# Patient Record
Sex: Male | Born: 1935 | Race: White | Hispanic: No | Smoking: Former smoker
Health system: Southern US, Community
[De-identification: ages and names within clinical notes are randomized; demographics above are authoritative.]

## PROBLEM LIST (undated history)

## (undated) DIAGNOSIS — Z8501 Personal history of malignant neoplasm of esophagus: Secondary | ICD-10-CM

## (undated) DIAGNOSIS — C801 Malignant (primary) neoplasm, unspecified: Secondary | ICD-10-CM

## (undated) DIAGNOSIS — K219 Gastro-esophageal reflux disease without esophagitis: Secondary | ICD-10-CM

## (undated) DIAGNOSIS — R7303 Prediabetes: Secondary | ICD-10-CM

## (undated) DIAGNOSIS — I4510 Unspecified right bundle-branch block: Secondary | ICD-10-CM

## (undated) DIAGNOSIS — N200 Calculus of kidney: Secondary | ICD-10-CM

## (undated) DIAGNOSIS — L237 Allergic contact dermatitis due to plants, except food: Secondary | ICD-10-CM

## (undated) DIAGNOSIS — Z87442 Personal history of urinary calculi: Secondary | ICD-10-CM

## (undated) HISTORY — DX: Unspecified right bundle-branch block: I45.10

## (undated) HISTORY — DX: Malignant (primary) neoplasm, unspecified: C80.1

## (undated) HISTORY — PX: OTHER SURGICAL HISTORY: SHX169

---
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
## 2001-01-13 ENCOUNTER — Ambulatory Visit (HOSPITAL_COMMUNITY): Admission: RE | Admit: 2001-01-13 | Discharge: 2001-01-13 | Payer: Self-pay | Admitting: *Deleted

## 2001-01-13 ENCOUNTER — Encounter (INDEPENDENT_AMBULATORY_CARE_PROVIDER_SITE_OTHER): Payer: Self-pay | Admitting: Specialist

## 2001-10-26 ENCOUNTER — Encounter (INDEPENDENT_AMBULATORY_CARE_PROVIDER_SITE_OTHER): Payer: Self-pay | Admitting: Specialist

## 2001-10-26 ENCOUNTER — Ambulatory Visit (HOSPITAL_COMMUNITY): Admission: RE | Admit: 2001-10-26 | Discharge: 2001-10-26 | Payer: Self-pay | Admitting: *Deleted

## 2002-07-07 ENCOUNTER — Ambulatory Visit (HOSPITAL_COMMUNITY): Admission: RE | Admit: 2002-07-07 | Discharge: 2002-07-07 | Payer: Self-pay | Admitting: *Deleted

## 2002-07-07 ENCOUNTER — Encounter (INDEPENDENT_AMBULATORY_CARE_PROVIDER_SITE_OTHER): Payer: Self-pay | Admitting: *Deleted

## 2003-11-08 ENCOUNTER — Encounter (INDEPENDENT_AMBULATORY_CARE_PROVIDER_SITE_OTHER): Payer: Self-pay | Admitting: Specialist

## 2003-11-08 ENCOUNTER — Ambulatory Visit (HOSPITAL_COMMUNITY): Admission: RE | Admit: 2003-11-08 | Discharge: 2003-11-08 | Payer: Self-pay | Admitting: *Deleted

## 2004-06-03 ENCOUNTER — Encounter (INDEPENDENT_AMBULATORY_CARE_PROVIDER_SITE_OTHER): Payer: Self-pay | Admitting: Specialist

## 2004-06-03 ENCOUNTER — Ambulatory Visit (HOSPITAL_COMMUNITY): Admission: RE | Admit: 2004-06-03 | Discharge: 2004-06-03 | Payer: Self-pay | Admitting: *Deleted

## 2005-02-17 ENCOUNTER — Ambulatory Visit (HOSPITAL_COMMUNITY): Admission: RE | Admit: 2005-02-17 | Discharge: 2005-02-17 | Payer: Self-pay | Admitting: *Deleted

## 2005-02-17 ENCOUNTER — Encounter (INDEPENDENT_AMBULATORY_CARE_PROVIDER_SITE_OTHER): Payer: Self-pay | Admitting: *Deleted

## 2005-02-25 ENCOUNTER — Encounter: Admission: RE | Admit: 2005-02-25 | Discharge: 2005-02-25 | Payer: Self-pay | Admitting: *Deleted

## 2005-02-26 ENCOUNTER — Ambulatory Visit: Payer: Self-pay | Admitting: Gastroenterology

## 2005-02-26 ENCOUNTER — Ambulatory Visit (HOSPITAL_COMMUNITY): Admission: RE | Admit: 2005-02-26 | Discharge: 2005-02-26 | Payer: Self-pay | Admitting: Gastroenterology

## 2005-02-26 ENCOUNTER — Encounter (INDEPENDENT_AMBULATORY_CARE_PROVIDER_SITE_OTHER): Payer: Self-pay | Admitting: Specialist

## 2005-03-17 ENCOUNTER — Ambulatory Visit (HOSPITAL_COMMUNITY): Admission: RE | Admit: 2005-03-17 | Discharge: 2005-03-17 | Payer: Self-pay | Admitting: Thoracic Surgery

## 2005-03-30 ENCOUNTER — Inpatient Hospital Stay (HOSPITAL_COMMUNITY): Admission: EM | Admit: 2005-03-30 | Discharge: 2005-04-10 | Payer: Self-pay | Admitting: Thoracic Surgery

## 2005-03-31 ENCOUNTER — Encounter (INDEPENDENT_AMBULATORY_CARE_PROVIDER_SITE_OTHER): Payer: Self-pay | Admitting: *Deleted

## 2005-04-15 ENCOUNTER — Encounter: Admission: RE | Admit: 2005-04-15 | Discharge: 2005-04-15 | Payer: Self-pay | Admitting: Thoracic Surgery

## 2005-05-07 ENCOUNTER — Encounter: Admission: RE | Admit: 2005-05-07 | Discharge: 2005-05-07 | Payer: Self-pay | Admitting: Thoracic Surgery

## 2005-05-23 ENCOUNTER — Encounter: Admission: RE | Admit: 2005-05-23 | Discharge: 2005-05-23 | Payer: Self-pay | Admitting: *Deleted

## 2005-06-18 ENCOUNTER — Encounter: Admission: RE | Admit: 2005-06-18 | Discharge: 2005-06-18 | Payer: Self-pay | Admitting: Thoracic Surgery

## 2005-06-24 ENCOUNTER — Observation Stay (HOSPITAL_COMMUNITY): Admission: EM | Admit: 2005-06-24 | Discharge: 2005-06-25 | Payer: Self-pay | Admitting: Emergency Medicine

## 2005-06-24 ENCOUNTER — Encounter (INDEPENDENT_AMBULATORY_CARE_PROVIDER_SITE_OTHER): Payer: Self-pay | Admitting: Specialist

## 2005-06-24 HISTORY — PX: LAPAROSCOPIC CHOLECYSTECTOMY: SUR755

## 2005-07-23 ENCOUNTER — Ambulatory Visit (HOSPITAL_COMMUNITY): Admission: RE | Admit: 2005-07-23 | Discharge: 2005-07-23 | Payer: Self-pay | Admitting: *Deleted

## 2005-07-30 ENCOUNTER — Encounter: Admission: RE | Admit: 2005-07-30 | Discharge: 2005-07-30 | Payer: Self-pay | Admitting: Thoracic Surgery

## 2005-10-28 ENCOUNTER — Encounter: Admission: RE | Admit: 2005-10-28 | Discharge: 2005-10-28 | Payer: Self-pay | Admitting: Thoracic Surgery

## 2006-02-16 ENCOUNTER — Encounter: Admission: RE | Admit: 2006-02-16 | Discharge: 2006-02-16 | Payer: Self-pay | Admitting: Otolaryngology

## 2006-02-25 ENCOUNTER — Encounter: Admission: RE | Admit: 2006-02-25 | Discharge: 2006-02-25 | Payer: Self-pay | Admitting: Thoracic Surgery

## 2006-05-04 ENCOUNTER — Encounter (INDEPENDENT_AMBULATORY_CARE_PROVIDER_SITE_OTHER): Payer: Self-pay | Admitting: *Deleted

## 2006-05-04 ENCOUNTER — Ambulatory Visit (HOSPITAL_COMMUNITY): Admission: RE | Admit: 2006-05-04 | Discharge: 2006-05-04 | Payer: Self-pay | Admitting: *Deleted

## 2006-08-20 ENCOUNTER — Ambulatory Visit (HOSPITAL_COMMUNITY): Admission: RE | Admit: 2006-08-20 | Discharge: 2006-08-20 | Payer: Self-pay | Admitting: *Deleted

## 2006-08-20 ENCOUNTER — Encounter (INDEPENDENT_AMBULATORY_CARE_PROVIDER_SITE_OTHER): Payer: Self-pay | Admitting: Specialist

## 2006-09-15 ENCOUNTER — Encounter: Admission: RE | Admit: 2006-09-15 | Discharge: 2006-09-15 | Payer: Self-pay | Admitting: Thoracic Surgery

## 2006-09-15 ENCOUNTER — Ambulatory Visit: Payer: Self-pay | Admitting: Thoracic Surgery

## 2006-12-16 ENCOUNTER — Ambulatory Visit (HOSPITAL_COMMUNITY): Admission: RE | Admit: 2006-12-16 | Discharge: 2006-12-16 | Payer: Self-pay | Admitting: Internal Medicine

## 2007-09-26 IMAGING — RF DG ESOPHAGUS
10 of 13 series · 17 of 24 positions shown · non-contrast
Comparison: none

CLINICAL DATA: Dysphagia.  Prior esophagogastrectomy and now difficulty swallowing.
 BARIUM SWALLOW:

[Series 1: run · 2 of 9 slices shown (1 of 10)]
[im 1/9]
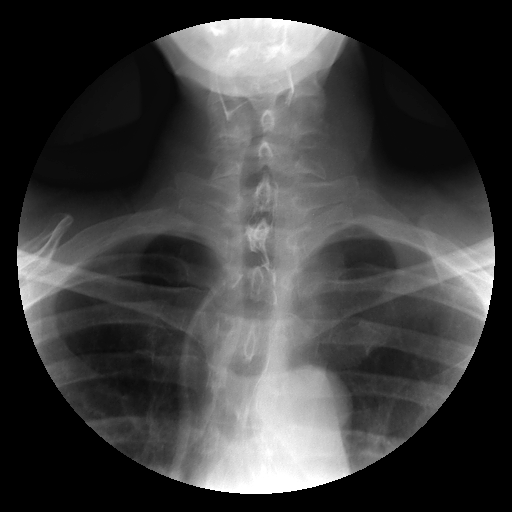
[im 9/9]
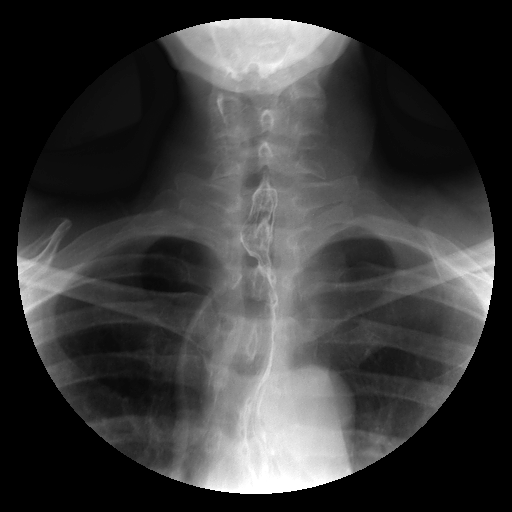

[Series 2: run · 2 of 6 slices shown (2 of 10)]
[im 1/6]
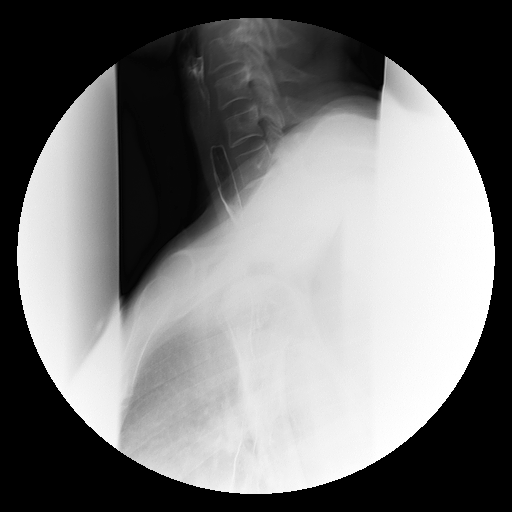
[im 6/6]
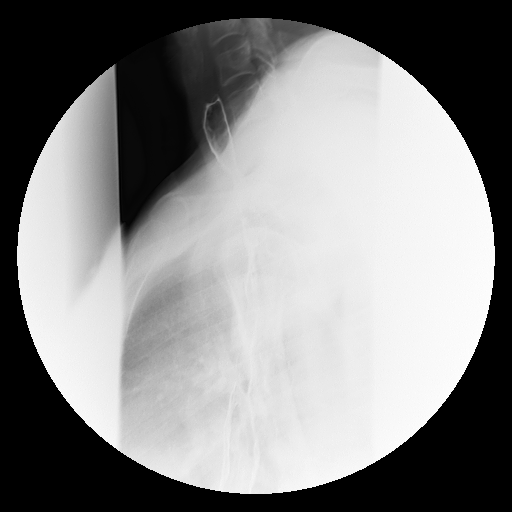

[Series 4: run · 1 of 1 slices shown (3 of 10)]
[im 1/1]
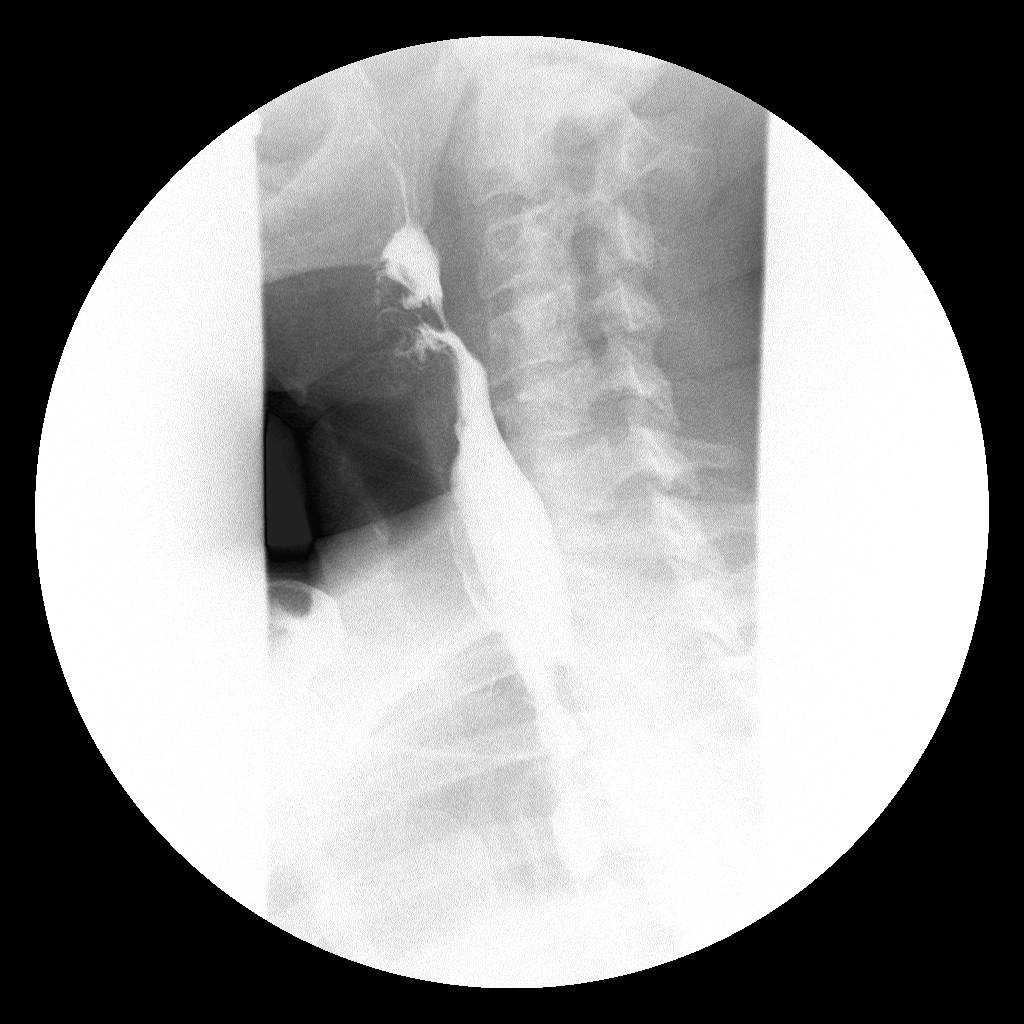

[Series 5: run · 1 of 1 slices shown (4 of 10)]
[im 1/1]
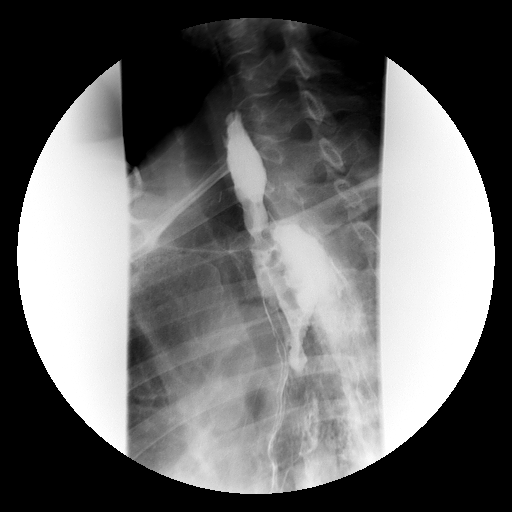

[Series 6: run · 3 of 12 slices shown (5 of 10)]
[im 3/12]
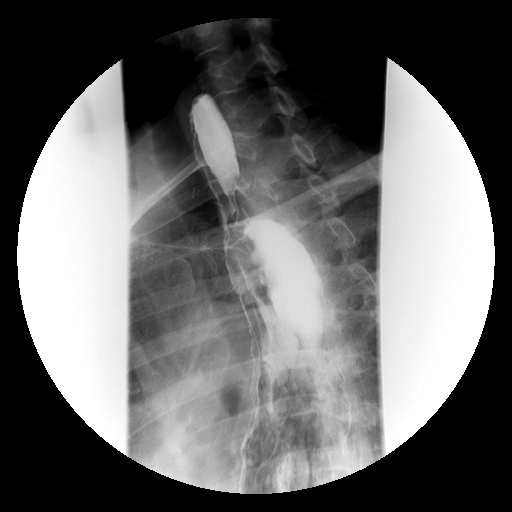
[im 6/12]
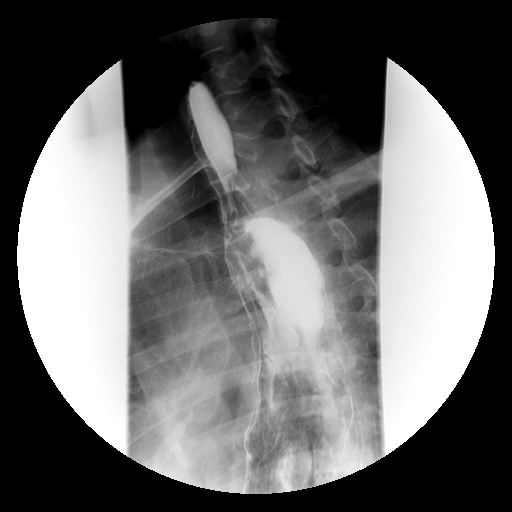
[im 12/12]
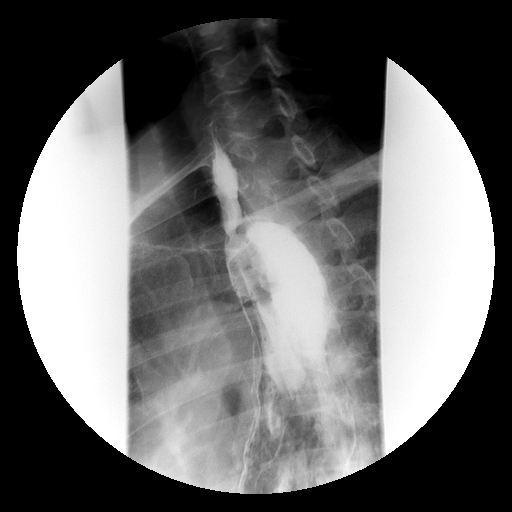

[Series 7: run · 1 of 1 slices shown (6 of 10)]
[im 1/1]
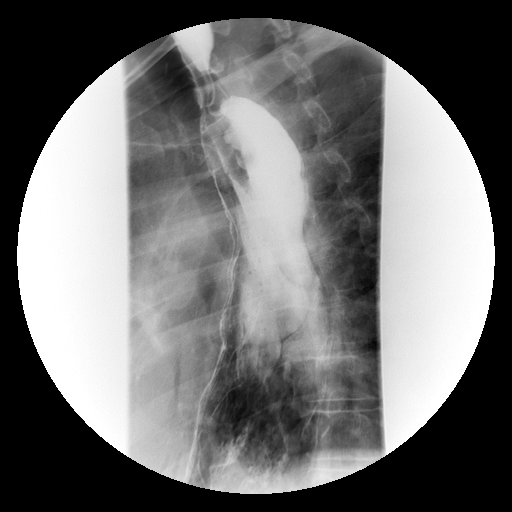

[Series 8: run · 1 of 1 slices shown (7 of 10)]
[im 1/1]
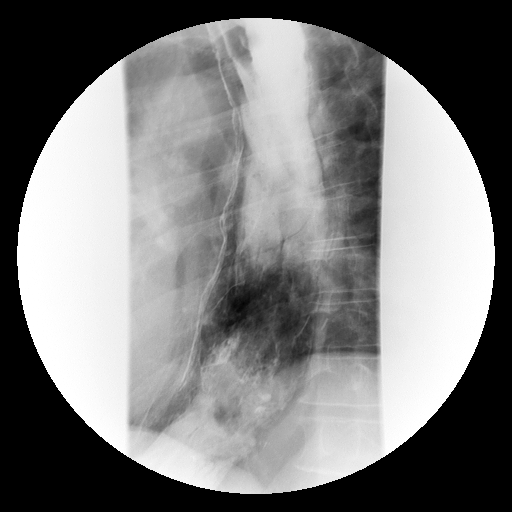

[Series 10: run · 1 of 1 slices shown (8 of 10)]
[im 1/1]
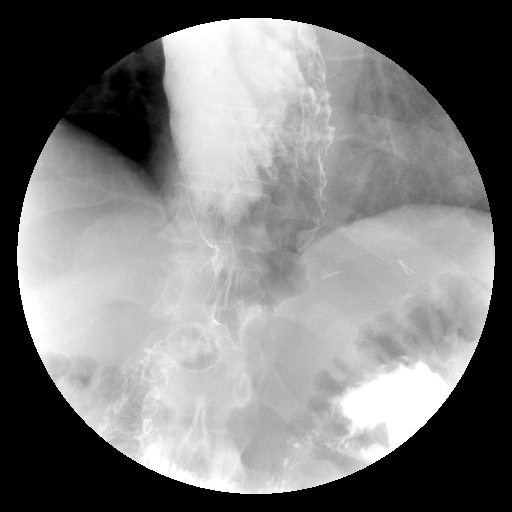

[Series 11: run · 1 of 1 slices shown (9 of 10)]
[im 1/1]
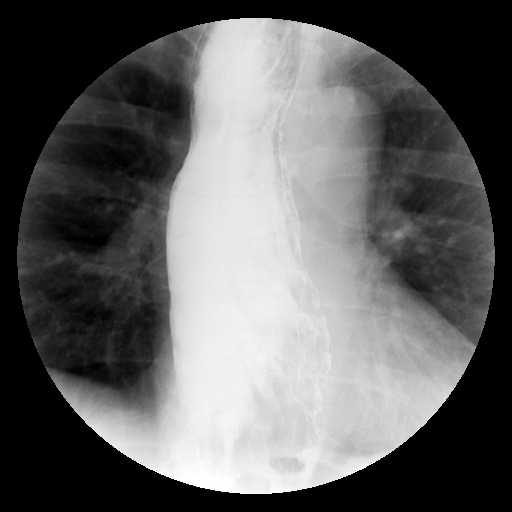

[Series 13: run · 4 of 11 slices shown (10 of 10)]
[im 1/11]
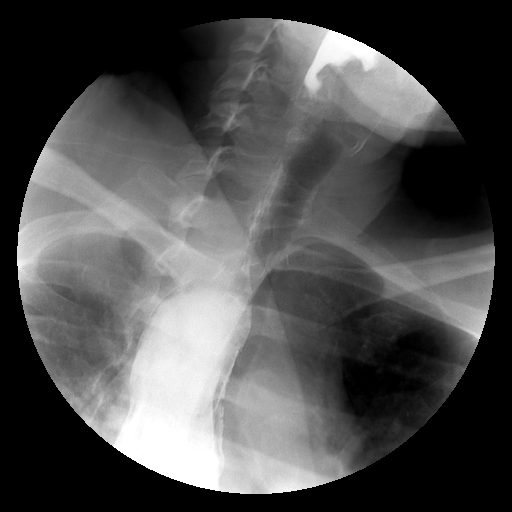
[im 3/11]
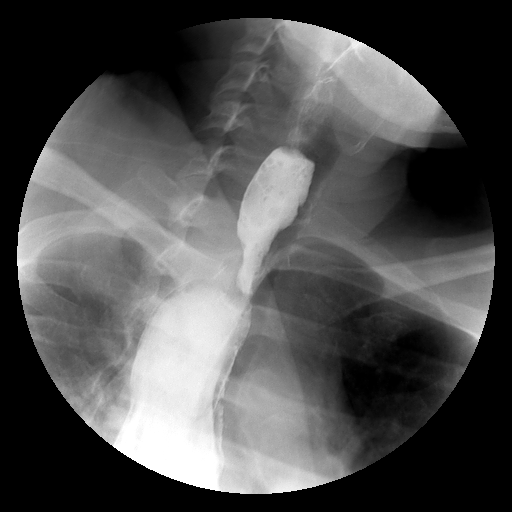
[im 6/11]
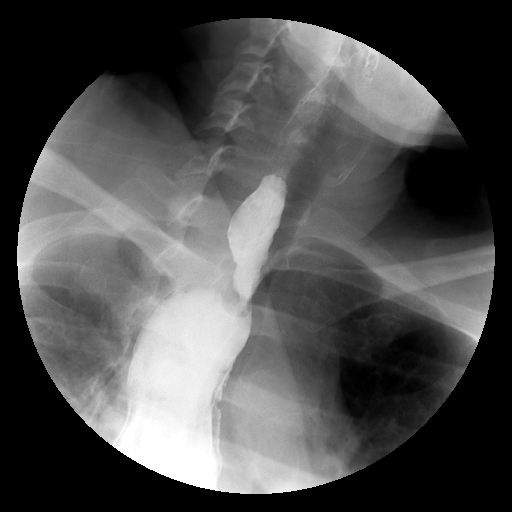
[im 11/11]
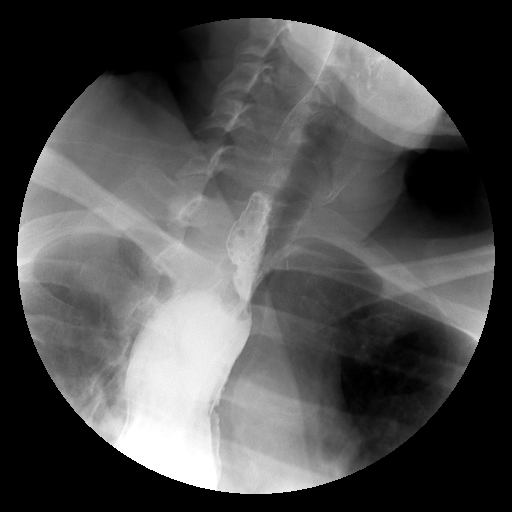

[17 of 24 positions shown; findings below may reference images not displayed]

FINDINGS: A single barium swallow was performed.  There is persistent narrowing at the level of anastomosis within the upper thoracic esophagus in this patient who has undergone gastric pull through.   The swallowing mechanism appears grossly normal.  The gastric pull through appears normal.
IMPRESSION: Persistent narrowing at the level of anastomosis of the upper thoracic esophagus with a gastric pull through.  No other abnormality.

## 2007-11-17 ENCOUNTER — Ambulatory Visit (HOSPITAL_COMMUNITY): Admission: RE | Admit: 2007-11-17 | Discharge: 2007-11-17 | Payer: Self-pay | Admitting: *Deleted

## 2007-11-17 ENCOUNTER — Encounter (INDEPENDENT_AMBULATORY_CARE_PROVIDER_SITE_OTHER): Payer: Self-pay | Admitting: *Deleted

## 2008-01-13 ENCOUNTER — Encounter: Admission: RE | Admit: 2008-01-13 | Discharge: 2008-01-13 | Payer: Self-pay | Admitting: Cardiovascular Disease

## 2008-03-31 ENCOUNTER — Encounter: Admission: RE | Admit: 2008-03-31 | Discharge: 2008-04-26 | Payer: Self-pay | Admitting: Internal Medicine

## 2008-04-26 ENCOUNTER — Encounter: Admission: RE | Admit: 2008-04-26 | Discharge: 2008-04-26 | Payer: Self-pay | Admitting: *Deleted

## 2008-05-23 ENCOUNTER — Ambulatory Visit (HOSPITAL_COMMUNITY): Admission: RE | Admit: 2008-05-23 | Discharge: 2008-05-23 | Payer: Self-pay | Admitting: *Deleted

## 2008-05-23 ENCOUNTER — Encounter (INDEPENDENT_AMBULATORY_CARE_PROVIDER_SITE_OTHER): Payer: Self-pay | Admitting: *Deleted

## 2010-09-10 NOTE — Op Note (Signed)
Peter Richmond, Peter Richmond               ACCOUNT NO.:  [id number]   MEDICAL RECORD NO.:  [id number]          PATIENT TYPE:  AMB   LOCATION:  ENDO                         FACILITY:  [facility]   PHYSICIAN:  Georgiana Spinner, M.D.    DATE OF BIRTH:  Sep 09, 1931   DATE OF PROCEDURE:  DATE OF DISCHARGE:                               OPERATIVE REPORT   PROCEDURE:  Upper endoscopy with dilation and biopsy.   INDICATIONS:  Dysphagia.   ANESTHESIA:  Fentanyl 75 mcg, Versed 7 mg.   PROCEDURE:  With the patient mildly sedated in the left lateral  decubitus position, the Pentax videoscopic endoscope was inserted in the  mouth, passed under direct vision through the esophagus, into the  stomach and intestine.  Photographs were taken.  From this point, the  endoscope was pulled back into the stomach and a guidewire was passed.  The endoscope was pulled back to the gastroesophageal junction and this  was located fluoroscopically.  The endoscope was then withdrawn.  Subsequently, a 15 Savary dilator was passed rather easily over the  guidewire and then the guidewire was removed with the dilator.  The  endoscope was reinserted to this level and a biopsy was taken of what  appears to be Barrett's esophagus recurrence.  The endoscope was  withdrawn.  The patient's vital signs and pulse oximeter remained  stable.  The patient tolerated the procedure well without apparent  complications.   FINDINGS:  Narrowing of anastomosis of the stomach and esophagus with  biopsies taken after dilation.   PLAN:  Await biopsy report to aim clinical response.  The patient will  call me for results and follow up with me as an outpatient.           ______________________________  Georgiana Spinner, M.D.     GMO/MEDQ  D:  05/23/2008  T:  05/23/2008  Job:  [id number]

## 2010-09-10 NOTE — Op Note (Signed)
NAMESHANDY, VI               ACCOUNT NO.:  [id number]   MEDICAL RECORD NO.:  [id number]          PATIENT TYPE:  AMB   LOCATION:  ENDO                         FACILITY:  [facility]   PHYSICIAN:  Georgiana Spinner, M.D.    DATE OF BIRTH:  1932/02/06   DATE OF PROCEDURE:  11/17/2007  DATE OF DISCHARGE:                               OPERATIVE REPORT   PROCEDURE:  Upper endoscopy.   INDICATIONS:  GERD with Barrett's esophagus and previous resection for  adenocarcinoma.   ANESTHESIA:  Fentanyl 75 mcg, Versed 5 mg.   PROCEDURE:  With the patient mildly sedated in the left lateral  decubitus position, the Pentax videoscopic endoscope was inserted in the  mouth, passed under direct vision through the esophagus which appeared  normal until we reached the distal esophagus which was short and there  appeared to be an area of Barrett's photographed and biopsied.  We  entered into the stomach.  Fundus, body was all that was remaining.  We  advanced to small bowel which appeared normal and was photographed.  The  endoscope was then pulled back into the stomach placed in retroflexion  to view the stomach from below.  The endoscope was straightened and  withdrawn taking circumferential views of the remaining gastric and  esophageal mucosa stopping in the proximal esophagus where there was a  whitish nodule that was photographed and biopsied.  The patient's vital  signs, pulse oximeter remained stable.  The patient tolerated procedure  well without apparent complication.   FINDINGS:  Proximal esophageal nodule, distal esophageal Barrett's.  Await biopsy reports.  The patient will call me for results and follow-  up with me as needed as an outpatient.           ______________________________  Georgiana Spinner, M.D.     GMO/MEDQ  D:  11/17/2007  T:  11/17/2007  Job:  [id number]

## 2010-09-13 NOTE — Procedures (Signed)
[facility]  Patient:    Peter Richmond, Peter Richmond Visit Number: [id number] MRN: [id number]          Service Type: END Location: ENDO Attending Physician:  Sabino Gasser Dictated by:   Sabino Gasser, M.D. Proc. Date: 01/13/01 Admit Date:  01/13/2001                             Procedure Report  PROCEDURE PERFORMED:  Colonoscopy.  ENDOSCOPIST:  Sabino Gasser, M.D.  INDICATIONS FOR PROCEDURE:  Colon polyps.  ANESTHESIA:  None further given.  See endoscopy note.  DESCRIPTION OF PROCEDURE:  With the patient mildly sedated in the left lateral decubitus position, a rectal exam was performed which was unremarkable. Subsequently, the Olympus videoscopic colonoscope was inserted in the rectum and passed under direct vision into the cecum.  The cecum was identified by the ileocecal valve and appendiceal orifice, both of which were photographed. From this point, the colonoscope was slowly withdrawn, taking circumferential views of the entire colonic mucosa stopping in the rectum only which appeared normal on direct and showed internal hemorrhoids on retroflex view.  The endoscope was straightened and withdrawn.  Patients vital signs and pulse oximeter remained stable.  The patient tolerated the procedure well and without apparent complications.  FINDINGS:  Significant diverticular disease of the sigmoid colon.  Internal hemorrhoids.  Otherwise unremarkable colonoscopic examination.  PLAN:  See endoscopy note for further evaluation. Dictated by:   Sabino Gasser, M.D. Attending Physician:  Sabino Gasser DD:  01/13/01 TD:  01/13/01 Job: [id number]

## 2010-09-13 NOTE — Op Note (Signed)
NAMEJYLAN, LOEZA               ACCOUNT NO.:  [id number]   MEDICAL RECORD NO.:  [id number]          PATIENT TYPE:  INP   LOCATION:  2310                         FACILITY:  [facility]   PHYSICIAN:  Ines Bloomer, M.D. 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   POSTOPERATIVE DIAGNOSIS:  Barrett's esophagus with stage I adenocarcinoma of  the esophagus, high grade dysplasia.   OPERATION PERFORMED:  Transhiatal esophagectomy, jejunostomy, pyloroplasty.   SURGEON:  Ines Bloomer, M.D.   FIRST ASSISTANT:  Rowe Clack, P.A.-C.   ANESTHESIA:  General.   After the percutaneous insertion of all monitoring lines, the patient  underwent general anesthesia and was placed in the supine position with the  neck extended after elevating it on a roll and turning the neck to the right  side.  A xiphoid to midline incision was made stopping just about 3-4 cm  above the umbilicus.  The midline was entered and exploration was carried  out.  A Kocher maneuver was performed dissecting out the duodenum freeing it  up in the avascular plane posteriorly and dissecting it medially to the  pancreas across the inferior vena cava.  After the Kocher maneuver was done,  the ligament to the left lateral segment of the liver was taken down from  the diaphragm to pull the liver segment over to expose the hiatus.  After  this had been done, a Balfour retractor was placed and then the upper hand  retractor was placed to elevate the costal margin as well as to hold the  liver up.  Dissection was started in the greater curvature, got into the  lesser sac, and the omentum was divided between the stomach and the  transverse colon preserving the right gastroepiploic arcade.  This was  divided between clamps and  tied with a 2-0 silk or divided with  electrocautery.  It was divided from the gastroepiploic artery and then  superiorly going along the greater curvature.  Then, we used the Harmonic  scalpel to divide the short gastrics and dissect the greater curvature of  the cardia off the spleen.  Then, the lesser omentum was divided, again,  with electrocautery and the Harmonic scalpel and we were able to dissect the  stomach up, identifying the coronary vein and right gastric artery which was  clamped with a vascular clamp.  Then, the esophagus was dissected free from  around the stomach.  There was no gross mass felt because the patient had  had Barrett's esophagus, high grade dysplasia, and had stage I carcinoma by  previous biopsy by Dr. Virginia Rochester and Dr. Christella Hartigan.  Then, using the Harmonic  scalpel, the distal esophagus was dissected free from the surrounding  tissues and then we had to use blunt dissection to dissect the esophagus up  tracheal bifurcation.  Attention was turned to the jejunum and 18-20 cm from  the ligament of  Treitz, a horizontal mattress suture was placed in the  antimesenteric border and the jejunum was opened and a 16 red rubber  Roxan Hockey was inserted and tied in place with 0 silk.  Approximately 10-12 cm  from the insertion, we noticed a small bowel diverticulum, it did not appear  to be inflamed.  We asked Dr. Johna Sheriff, the general surgeon to give [place] an  opinion, and he said that we should just leave this alone and we did.  We  did run the jejunostomy tube 8-10 cm distal to this.  Then, a Dalbert Batman  jejunostomy was performed imbricating the jejunostomy tube with 3-0 silk on  the antimesenteric border bringing the jejunostomy tube out through a  separate stab wound and tied in place with 0 silk and suturing the jejunum  to the abdominal wall with 3-0 silk.  Attention was turned to the pylorus  and the pylorus was opened longitudinally with electrocautery and the  Harmonic scalpel  and then closed in a Heineke-Mikulicz fashion, opening it  longitudinally and closing it transversely.  We used a Gambee stitch to  close it transversely and then interrupted #1 through and through silks  medially and laterally to the Gambee which was the center silk suture.  After this had been done, several more reinforced Lembert sutures were  placed and then the omentum was tied as a patch to the suture line to  reinforce the suture line.  After the Heineke-Mikulicz jejunostomy had been  done, the left neck was opened with an incision along the left  sternocleidomastoid, dissection was carried down reflecting the internal  jugular vein and carotid artery and vagus nerve out laterally and  identifying the esophagus which was looped with a Penrose drain and then  proximal dissection was done very gently to try to preserve the left  recurrent nerve which was identified.  In this way, we were able to dissect  the entire esophagus up, the proximal esophagus was dilated with an EZ-45  stapler and a suture placed on the esophagus and tied to an umbilical tape  which was pulled through the posterior mediastinum.  Then, we used the endo-  GIA stapler to divide across the proximal margin of the proximal stomach  going from the cardia to the lesser curvature with two applications of the  stapler and the specimen was sent for frozen section and the proximal and  distal margins were negative for cancer.  Then, the staple line was  reinforced with running 4-0 Prolene.  The stomach was then placed in a  camera bag and a 30 mL balloon was pulled through the posterior mediastinum  and placed in the camera bag and the stomach was sutured to the camera bag  and we pulled the camera bag and the Foley catheter pulling the stomach up  through the posterior mediastinum up to the neck.  The camera bag and the Foley catheter were removed, a transverse opening was made in the stomach  and this was dilated, and  then the staple line from the esophagus was  removed.  The NG tube had been pulled back.  An HGB-35 stapler was then  fired with one limb in the esophagus and one limb in the stomach to create  the first part of the anastomosis.  Then, the anterior part of the esophagus  was then sutured to the stomach with interrupted 2-0 silk sutures and then  the anterior portion was closed in a triangular method using three 3-0  silk  sutures to bring the stomach to the staple line and then firing a TX stapler  across it medially and then laterally to complete the anastomosis.  Several  3-0 silk Lembert sutures were placed to reinforce the anastomosis.  The NG  tube was then placed down to the pylorus by palpation, a stab wound was made  laterally and a Jackson-Pratt  drain was brought out laterally.  Tisseel was  applied to the staple line.  The neck was then closed with interrupted 2-0  Vicryl in the muscle layer and Ethicon skin clips.  The stomach was sutured  to prevent herniation right at the hiatus.  Bleeding was electrocoagulated  and the area was copiously irrigated.  The abdomen was closed with running  #1 PDS and interrupted #1 Vicryl and Ethicon skin clips.  The patient was  then returned to the intensive care unit in serious condition.           ______________________________  Ines Bloomer, M.D.     DPB/MEDQ  D:  03/31/2005  T:  03/31/2005  Job:  [id number]   cc:   Georgiana Spinner, M.D.  Fax: [phone]

## 2010-09-13 NOTE — H&P (Signed)
NAMERODRIQUEZ, THORNER               ACCOUNT NO.:  [id number]   MEDICAL RECORD NO.:  [id number]          PATIENT TYPE:  INP   LOCATION:  5739                         FACILITY:  [facility]   PHYSICIAN:  Ines Bloomer, M.D. DATE OF BIRTH:  1932/04/02   DATE OF ADMISSION:  03/30/2005  DATE OF DISCHARGE:                                HISTORY & PHYSICAL   PRIMARY CARE PHYSICIAN:  Dr. Lendell Caprice.   GASTROENTEROLOGIST PHYSICIAN:  Dr. Virginia Rochester.   CHIEF COMPLAINT:  Esophageal cancer.   HISTORY OF PRESENT ILLNESS:  This is a 77 year old Caucasian male who has  been followed by Dr. Lendell Caprice for gastroesophageal reflux disease for many  years. Approximately six year ago, he was referred to Dr. Virginia Rochester for an upper  endoscopy as a follow-up regarding his reflux disease. He was found to have  Barrett's esophagus at that time and has been followed with routine  endoscopy initially one time per year and then two times per year for the  last three years of follow-up. In February of 2005, he was found to have a  low grade dysplasia and in October of 2006 an esophageal cancer was found.  The patient has been referred by Dr. Virginia Rochester to Dr. Edwyna Shell for surgical therapy.  The patient does occasionally complain of a cough with sputum production  that is clear. He denies hemoptysis, shortness of breath, dyspnea on  exertion, orthopnea, fever, chills, weight changes, history of PE/DVT,  peripheral edema, angina, palpitations, and TIA/CVA symptoms. The patient  does have reflux symptoms.   The patient is being admitted today for preparation for surgery in the a.m.  of March 31, 2005.   PAST MEDICAL HISTORY:  1.  Barrett's esophagus.  2.  Gastroesophageal reflux disease.  3.  Asthma.  4.  Esophageal cancer.   PAST SURGICAL HISTORY:  Tonsillectomy.   ALLERGIES:  PREVACID which causes a rash.   MEDICATIONS:  1.  Risperdal 350 milligrams p.r.n. back pain.  2.  Allegra 180 milligrams p.o. daily.  3.  Nexium p.o.  b.i.d.  4.  Advair 100 per 50 inhaled p.r.n.  5.  Flonase 50 mcg 1-2 sprays each nostril p.r.n.  6.  Mobic 7.5 milligrams p.o. p.r.n. back pain.  7.  Aspirin 325 milligrams p.o. daily.   REVIEW OF SYSTEMS:  Please see HPI for significant positives and negatives.  Otherwise negative for diabetes mellitus, kidney disease and cardiac  disease.   SOCIAL HISTORY:  This is a married male with two children, lives with  family. He never drinks alcohol and only smoked an occasional cigar 15 or 20  years ago. He is retired and does continue to drive. He will have assistance  available after discharge.   FAMILY HISTORY:  Mother with myocardial infarction. Father with myocardial  infarction and question of pancreatic disease. Siblings with myocardial  infarction and diabetes mellitus.   PHYSICAL EXAMINATION:  VITAL SIGNS:  Blood pressure 127/70, heart rate 74,  respirations 20, temperature 97.5, weight 200 pounds.  GENERAL:  This is a 77 year old Caucasian male in no acute distress. He is  alert and oriented.  HEENT:  Normocephalic and atraumatic. Pupils are equal, round, and reactive  to light and accommodation. Extraocular movements intact. Oral mucosa is  pink and moist.  NECK:  Supple with no JVD and no bruits. The carotids are palpable. There is  palpable anterior chain lymphadenopathy bilaterally.  LUNGS:  Respirations are symmetric, unlabored and clear.  CARDIOVASCULAR:  Regular rate and rhythm. No murmurs, gallops, or rubs.  ABDOMEN:  Soft, nontender, and nondistended with normal active bowel sounds.  GENITOURINARY:  Deferred.  RECTAL:  Deferred.  EXTREMITIES:  There is no evidence of edema, varicosities, venous stasis  changes or skin breakdown. All extremities are warm to the touch.  PULSES:  Radial, femoral, popliteal and pedal pulses are 2+ bilaterally.  NEUROLOGICAL:  Nonfocal. The patient is alert and oriented x3. Gait is  steady. Muscle strength is 5/5 throughout all  extremities and symmetric.  Deep tendon reflexes are 2+.   ASSESSMENT:  Esophageal cancer in the setting of Barrett's esophagus.   PLAN:  1.  Transhiatal esophagectomy March 31, 2005 by Dr. Edwyna Shell.  2.  Admission on March 30, 2005 for bowel prep as well as labs and EKG      prior to surgery.      Pecola Leisure, PA    ______________________________  Ines Bloomer, M.D.    AY/MEDQ  D:  03/30/2005  T:  03/31/2005  Job:  [id number]   cc:   Patient's chart

## 2010-09-13 NOTE — Op Note (Signed)
NAMERICKY, GALLERY               ACCOUNT NO.:  [id number]   MEDICAL RECORD NO.:  [id number]          PATIENT TYPE:  AMB   LOCATION:  ENDO                         FACILITY:  [facility]   PHYSICIAN:  Georgiana Spinner, M.D.    DATE OF BIRTH:  [birth year]   DATE OF PROCEDURE:  DATE OF DISCHARGE:                                 OPERATIVE REPORT   PROCEDURE:  Upper endoscopy.   INDICATIONS:  Gastroesophageal reflux disease with Barrett's esophagus and  borderline low-grade dysplasia.   ANESTHESIA:  Demerol 50 mg, Versed 5 mg.   DESCRIPTION OF PROCEDURE:  With the patient mildly sedated in the left  lateral decubitus position, the Olympus videoscopic endoscope was inserted  in the mouth and passed under direct vision through the esophagus, which  appeared normal until we reached the distal esophagus and there was rather  extensive Barrett's seen, photographed and multiple biopsies taken around  the circumference of the Barrett's esophagus that was seen.  We then  subsequently entered into the stomach.  The  fundus, body, antrum, duodenal  bulb, and second portion of the duodenum were visualized.  From this point,  the endoscope was slowly withdrawn taking circumferential views of the  duodenal mucosa until the endoscope had been pulled back into the stomach,  placed in retroflexion and viewed the stomach from below.  The endoscope was  then straightened, withdrawn, taking circumferential views of the remaining  gastric and esophageal mucosa, stopping only in the body of the stomach  where a polyp was seen and biopsied.  The endoscope was then withdrawn  taking circumferential views of the remaining gastric and esophageal mucosa,  stopping to biopsy further the changes of Barrett's.  The patient's vital  signs and pulse oximetry remained stable.  The patient tolerated the  procedure well without apparent complication.   FINDINGS:  1.  Barrett's esophagus, biopsied.  2.  Small polyp with body  of stomach biopsied.   PLAN:  Await biopsy report.  The patient will call me for results and follow  up with me as an outpatient.      GMO/MEDQ  D:  06/03/2004  T:  06/03/2004  Job:  [id number]

## 2010-09-13 NOTE — Op Note (Signed)
Peter Richmond, Peter Richmond               ACCOUNT NO.:  [id number]   MEDICAL RECORD NO.:  [id number]          PATIENT TYPE:  AMB   LOCATION:  ENDO                         FACILITY:  [facility]   PHYSICIAN:  Georgiana Spinner, M.D.    DATE OF BIRTH:  [birth year]   DATE OF PROCEDURE:  05/04/2006  DATE OF DISCHARGE:                               OPERATIVE REPORT   SURGEON:  Georgiana Spinner, M.D.   PROCEDURE:  Colonoscopy.   ANESTHESIA:  Fentanyl 80 mcg, Versed 6 mg.   DESCRIPTION OF PROCEDURE:  With the patient mildly sedated in the left  lateral decubitus position, a rectal examination was performed, which  was unremarkable.  Subsequently, the Pentax videoscopic pediatric  colonoscope was inserted into the rectum and passed under direct vision  to the cecum, identified by the ileocecal valve and base of the cecum,  both of which were photographed.  The prep was somewhat suboptimal,  surprisingly, in that there were areas where there was brown stool with  solid material that had to be suctioned and somewhat clogged the scope.  But from this point, the colonoscope was slowly withdrawn, taking  circumferential views of colonic mucosa, stopping to take random  biopsies from what appeared to be normal-appearing mucosa, until we  reached the rectum, which appeared normal.  But the rectum showed  hemorrhoids on retroflex view.  The endoscope was straightened and  withdrawn.  The patient's vital signs and pulse oximetry remained  stable.  The patient tolerated the procedure well and without apparent  complications.   FINDINGS:  1. Diverticulosis of sigmoid colon, moderately severe.  2. Internal hemorrhoids, otherwise an unremarkable examination,      somewhat limited by prep.   PLAN:  Await biopsy report.  The patient will call me for results and  follow up with me as an outpatient.           ______________________________  Georgiana Spinner, M.D.     GMO/MEDQ  D:  05/04/2006  T:  05/04/2006  Job:   [id number]

## 2010-09-13 NOTE — Procedures (Signed)
[place]  Patient:    Peter Richmond, Peter Richmond Visit Number: [id number] MRN: [id number]          Service Type: END Location: ENDO Attending Physician:  Sabino Gasser Dictated by:   Sabino Gasser, M.D. Admit Date:  10/26/2001                             Procedure Report  PROCEDURE:  Upper endoscopy.  INDICATION:  Follow-up of Barretts esophagus.  ANESTHESIA:  Demerol 40 mg, Versed 8 mg.  DESCRIPTION OF PROCEDURE:  With the patient mildly sedated in the left lateral decubitus position, the Olympus videoscopic endoscope was inserted in the mouth, advanced under direct vision through the esophagus.  The distal esophagus was approached, and extensive Barretts esophagus was seen and photographed and multiple biopsies taken.  We entered into the stomach through a hiatal hernia.  Fundus, body, antrum, duodenal bulb, and second portion of the duodenum were visualized.  From this point the endoscope was slowly withdrawn, taking circumferential views of the entire duodenal mucosa until the endoscope had been pulled back into the stomach and placed in retroflexion to view the stomach from below, and this showed the hiatal hernia once again and the widely patent GE junction.  The endoscope was then straightened and withdrawn, taking circumferential views of the remaining gastric and esophageal mucosa, stopping in the body and fundus of the stomach, where erythema was seen consistent with a possible gastritis and photographs and biopsies taken.  The endoscope was withdrawn.  The patients vital signs and pulse oximetry remained stable.  The patient tolerated the procedure well without apparent complications.  FINDINGS: 1. Extensive Barretts esophagus as seen previously, photographed and    biopsied. 2. Hiatal hernia. 3. Changes of erythema of body and fundus.  PLAN:  Await biopsy report.  The patient will call me for results and follow up as an outpatient.   Continue present therapy pending follow-up. Dictated by:   Sabino Gasser, M.D. Attending Physician:  Sabino Gasser DD:  10/26/01 TD:  10/27/01 Job: [id number]

## 2010-09-13 NOTE — Op Note (Signed)
NAMECAMERIN, Peter Richmond               ACCOUNT NO.:  [id number]   MEDICAL RECORD NO.:  [id number]          PATIENT TYPE:  AMB   LOCATION:  ENDO                         FACILITY:  [facility]   PHYSICIAN:  Georgiana Spinner, M.D.    DATE OF BIRTH:  1931/05/16   DATE OF PROCEDURE:  08/20/2006  DATE OF DISCHARGE:                               OPERATIVE REPORT   PROCEDURE:  Upper endoscopy   INDICATIONS:  Barrett's esophagus with previous adenocarcinoma resected.   ANESTHESIA:  Fentanyl 50 mcg, Versed 4 mg.   PROCEDURE:  With the patient mildly sedated in the left lateral  decubitus position, the Pentax videoscopic endoscope was inserted and  passed under direct vision through the esophagus to approximately 27 cm  from the incisors where we encountered the squamocolumnar junction which  we photographed and biopsied.  We entered into the stomach.  Fundus,  body and gastric remnant were visualized and we advanced through this  into the intestinal tract which appeared normal.  From this point the  endoscope was slowly withdrawn taking circumferential views of the small  bowel and gastric mucosa until the endoscope was then withdrawn taking  circumferential views of remaining esophageal mucosa as well.  The  patient's vital signs, pulse oximeter remained stable.  The patient  tolerated procedure well without apparent complications.   FINDINGS:  Question of continued Barrett's esophagus biopsied.  Await  biopsy report.  The patient will call me for results and follow-up with  me as an outpatient.           ______________________________  Georgiana Spinner, M.D.     GMO/MEDQ  D:  08/20/2006  T:  08/20/2006  Job:  [id number]   cc:   Ines Bloomer, M.D.

## 2010-09-13 NOTE — Discharge Summary (Signed)
NAMEMORRIS, Peter Richmond               ACCOUNT NO.:  [id number]   MEDICAL RECORD NO.:  [id number]          PATIENT TYPE:  INP   LOCATION:  2038                         FACILITY:  [facility]   PHYSICIAN:  Ines Bloomer, M.D. DATE OF BIRTH:  Mar 16, 1932   DATE OF ADMISSION:  03/30/2005  DATE OF DISCHARGE:  04/10/2005                                 DISCHARGE SUMMARY   ADMISSION DIAGNOSIS:  Barrett's esophagus with stage I adenocarcinoma of the  esophagus with high grade dysplasia.   DISCHARGE DIAGNOSIS:  1.  Barrett's esophagus with stage I adenocarcinoma of the esophagus with      high grade dysplasia status post esophagectomy (T1N0MX).  2.  Gastroesophageal reflux disease.  3.  Asthma.  4.  History of tonsillectomy.  5.  Postoperative hyperglycemia, improving.  6.  Allergy to Prevacid which causes a rash.   PROCEDURE:  March 31, 2005, transhiatal esophagectomy, jejunostomy, and  pyloroplasty by Dr. Ines Bloomer.   BRIEF HISTORY:  Mr. Minney is a 77 year old Caucasian male who has been  followed by Dr. Lendell Caprice for gastroesophageal reflux disease for many years.  Approximately six years ago, he was referred to Dr. Virginia Rochester for follow up  regarding his reflux disease.  He was found to have Barrett's esophagus and  has since been followed with routine endoscopy, initially yearly, and most  recently twice yearly.  In December 2005, he was found to have a low grade  dysplasia and in October 2006, esophageal cancer was found.  He was  subsequently referred to Dr. Edwyna Shell for surgical evaluation.  PET scan on  November 20 showed area of increased metabolic activity with 7 mm  paraesophageal lymph node.  The nodes were not enlarged, however, on  ultrasound and endoscopy.  At this point, it was felt he was clinically  stage I adenocarcinoma.  Subsequently, Dr. Edwyna Shell recommended that he  undergo transhiatal esophagectomy.  After discussing the risks, benefits,  and alternatives to this  procedure, the patient agreed to proceed.  In the  meantime, he remained on Nexium twice daily.   HOSPITAL COURSE:  On March 30, 2005, Mr. Kussman was electively admitted to  [facility].  On the following day, he was taken to the operating  room and did undergo esophagectomy as previously mentioned.  Postoperatively, he was transferred to the surgical intensive care unit.  Overall, Mr. Omlor had a relatively uneventful hospital course.  He did  initially require tube feeds with Peptamen via jejunostomy tube.  Ultimately, he underwent a Gastrografin and barium esophagogram on December  11 which was negative for esophageal leak.  Gradually, he was advanced to  clear liquids and gradually to frequent small meals using a soft diet.  He  was also seen by a dietician.  He reviewed post gastrectomy diet with both  the patient and his wife.  As he was tolerating his feeds, his tube feeds  were gradually decreased and eventually discontinued.  Postoperatively, he  was noted to have some hyperglycemia with blood sugars ranging initially  from 140 to 150.  However, as his tube feeds  were weaned, his blood sugars  were ranging primarily in the 120s.  This was treated with sliding scale  NovoLog insulin and it is anticipated that these would improve  postoperatively (preoperative non-fasting blood sugar 109).  Mr. Ducharme  remained in the intensive care unit until December 10.  He made good  progress and remained hemodynamically stable.  The Jackson-Pratt  drain  which was placed interoperatively in the left neck showed minimal drainage  and was eventually discontinued on December 13.  The nasogastric  tube was  also discontinued during this time.  The Foley catheter was removed, as  well, and he was able to void without difficulty.  Of note, he also had some  elevated liver enzymes postoperatively showing AST 144 and ALT 188 on  December 6, however, these were gradually improving with follow up  labs on  December 11 showing improvement in his AST to 51 and ALT to 98.  Again, Mr.  Shipes was transferred from the intensive care unit to the step down unit on  3300 on December 10.  He remained on the step down unit for two additional  days and then was transferred to general telemetry floor on postoperative  day nine.  At this point, he was found ready for discharge.  Again, he  remained afebrile with stable vital signs and maintaining sinus rhythm, his  oxygen saturations were above 90% on room air.  He was tolerating a soft  diet.  His bowels were functioning.  He was ambulating in the hallways  without difficulty.  He denied any dysphagia.  His wounds were healing well  without signs of infection.  Labs were also stable showing sodium 138,  potassium 3.5, chloride 104, CO2 28, non-fasting blood glucose 147, BUN 18,  creatinine 0.8, total bilirubin 0.8, alkaline phos 94, total protein 5.9,  albumin 2.7, calcium 8.7, AST 51, ALT 51.  White blood cell count 10.3,  hemoglobin 13.3, hematocrit 38.8, platelet count 320.  His albumin was low  normal at 18.6.  Postoperative amylase normal at 52.  Of note, his chest x-  ray on December 12 showed a right medial lung base opacity which was felt to  either represent atelectasis or infiltrate, however, his follow up chest x-  ray showed no significant change or worsening.  His white count remained  normal and he remained afebrile.  Pulmonary toilet was encouraged and it was  felt that this was most likely not a pneumonia, so antibiotics were,  therefore, not started, although he was treated with several days of Cefizox  routinely postoperatively.  Follow up chest x-ray will be obtained prior to  his discharge.   If there are no significant changes in Mr. Dy status, it is anticipated  that he will be ready for discharge home on postoperative day ten, April 10, 2005.  Prior to discharge, case management has been asked to assess his   discharge needs and we anticipate him going home with a home health nursing  aide.  He will also be instructed in J-tube site care and wound care prior  to discharge.   DISCHARGE MEDICATIONS:  Allegra 180 mg p.o. daily, Nexium 40 mg p.o. b.i.d.,  Advair 10/50 mcg, 1 puff inhaled b.i.d. p.r.n., Flonase 50 mcg 2 sprays per  nostril daily p.r.n., aspirin 325 mg daily, Reglan 10 mg p.o. t.i.d. until  further instructed by Dr. Edwyna Shell, Percocet 5/325 mg 1-2 tablets p.o. q.4h.  p.r.n. pain.   DISCHARGE INSTRUCTIONS:  He  is to follow a soft diet with small frequent  meals.  He is to avoid driving or heavy lifting for three weeks.  He may  increase his activity slowly and was encouraged to continue daily walking  exercises.  While his jejunostomy tube is in place, he should sponge bathe  and avoid submergence in water.  His incision should be cleaned daily gently  with mild soap and water.  He should also clean his J-tube site daily with  soap and water and keep this site clean and dry.  He should notify the [facility]  office if he develops fever greater than  101 or redness or drainage from his incision sites or drain sites, abdominal  pain, nausea and vomiting.  He is to follow up with Dr. Edwyna Shell at the [facility]  office on April 16, 2005, at 1 p.m. with a chest x-ray at [facility] at 12 p.m., he should call sooner if needed.      Jerold Coombe, P.A.    ______________________________  Ines Bloomer, M.D.    AWZ/MEDQ  D:  04/09/2005  T:  04/10/2005  Job:  [id number]   cc:   Georgiana Spinner, M.D.  Fax: [phone]   Rachael Fee, M.D.

## 2010-09-13 NOTE — Op Note (Signed)
NAMEGERLAD, PELZEL               ACCOUNT NO.:  [id number]   MEDICAL RECORD NO.:  [id number]          PATIENT TYPE:  INP   LOCATION:  1321                         FACILITY:  [facility]   PHYSICIAN:  Lebron Conners, M.D.   DATE OF BIRTH:  1931/08/17   DATE OF PROCEDURE:  06/24/2005  DATE OF DISCHARGE:  06/25/2005                                 OPERATIVE REPORT   PREOPERATIVE DIAGNOSIS:  Acute appendicitis versus acute cholecystitis.   POSTOPERATIVE DIAGNOSIS:  Acute cholecystitis and cholelithiasis   OPERATION:  Laparoscopic cholecystectomy.   SURGEON:  Dr. Lebron Conners.   ASSISTANT:  Dr. Glenna Fellows.   ANESTHESIA:  General and local.   ESTIMATED BLOOD LOSS:  150 mL.   SPECIMEN:  Gallbladder.   COMPLICATIONS:  None. The patient to PACU in good condition.   PROCEDURE:  After the patient was monitored and anesthetized and had routine  preparation and draping of the abdomen, I infiltrated local anesthetic just  below the umbilicus and made a short vertical incision and incised the  midline fascia for about 2 cm in the midline and then bluntly entered the  peritoneum. I found no adhesions in that region. There was some dark colored  fluid present along the right gutter. Looking toward the area of previous  esophagectomy and pull-through everything appeared to be well healed. There  was good visualization of the liver. The omentum was stuck to the  undersurface of the liver and gallbladder and I could not initially see the  gallbladder. I then put in three additional laparoscopic ports under direct  view and then bluntly took down the omentum a little bit and saw a very  acutely inflamed, gangrenous appearing gallbladder. I decompressed that with  a suction aspirator and then grasped the fundus of the gallbladder with a  ratchet grasper and elevated it. I then bluntly took down further adhesions  and utilizing a 30 degree viewing scope was able to see the infundibulum of  the gallbladder and grasp it and pull it laterally. I then was able to  dissect out and clearly identify the cystic duct emerging from the  infundibulum and the cystic artery crossing the triangle of Calot going into  the gallbladder. I clipped the cystic duct with four clips and cut between  the two which were closest to the gallbladder. I clipped the cystic artery  with three clips and cut between the two which were closest to the  gallbladder. Using cautery and blunt dissection. I then dissected the  gallbladder from the liver. I accidentally entered the gallbladder on its  posterior aspect at one point and sucked out the remaining bile. A couple of  small stones spilled out and I retrieved those. I detached the gallbladder  from the liver and placed it into a plastic pouch and reserved it. I then  copiously irrigated the area and got hemostasis as best I could with the  cautery. I packed the gallbladder fossa with a generous sized piece of  Surgicel and then placed in a 19-French Blake drain through the most lateral  laparoscopic port and placed  it in the gallbladder fossa and down into  Morison's pouch. I secured that to the skin with silk suture. I then  retrieved the gallbladder and pulled it out through the umbilical incision.  I had to minimally enlarge the umbilical incision to get it out and then it  came on out intact. I tied the pursestring suture and felt that the closure  was adequate by palpation. I then removed further irrigant and fluid from  the abdomen and removed the lateral laparoscopic port after determining that  hemostasis was good. Sponge, needle and instrument counts were correct. I  allowed the carbon dioxide to escape through the epigastric port and then  removed that as well and closed all skin incisions with intracuticular 4-0  Vicryl and Steri-Strips. The patient was stable at the end of the procedure.      Lebron Conners, M.D.  Electronically  Signed     WB/MEDQ  D:  06/24/2005  T:  06/25/2005  Job:  [id number]   cc:   Ines Bloomer, M.D.  [address]  [place]  [place] [address]   Janae Bridgeman. Eloise Harman., M.D.  Fax: [phone]

## 2010-09-13 NOTE — H&P (Signed)
NAMESARGON, Peter Richmond               ACCOUNT NO.:  [id number]   MEDICAL RECORD NO.:  [id number]          PATIENT TYPE:  EMS   LOCATION:  ED                           FACILITY:  [facility]   PHYSICIAN:  Lebron Conners, M.D.   DATE OF BIRTH:  1931-09-09   DATE OF ADMISSION:  06/24/2005  DATE OF DISCHARGE:                                HISTORY & PHYSICAL   CHIEF COMPLAINT:  Abdominal pain.   PRESENT ILLNESS:  Mr. Carns is a 77 year old white male who has had central  mostly lower abdominal pain for a couple of days, and it had localized to  the right lower quadrant. Dr. Lendell Caprice saw him in the office, thought he had  appendicitis and sent him to the [facility] where his white count was  18,000. He is afebrile, and CT scan has been done showing inflammation  around the gallbladder suggesting acute cholecystitis. However, no  gallstones were seen. The cecum and appendix lie in the region of the  gallbladder, but the appendix is not clearly seen. He is brought into the  hospital for treatment of this inflammatory process and immediate  laparoscopy with appendectomy and/or cholecystectomy whenever an operating  room is available.   PAST MEDICAL HISTORY:  1.  The patient has a history of GERD and Barrett's esophagus, and not long      ago underwent a transhiatal esophagectomy and gastric pull through to      the cervical esophagus by Dr. Edwyna Shell. He recovered well. It was a      favorable finding with a lot of dysplasia, but a T1 tumor. No other      chronic GI problems.  2.  The patient denies other serious chronic problems.  3.  He has had mild asthma, well-controlled.  4.  He had some hyperglycemia postoperatively, but that is it is getting      better. He has been on Protonix.   ALLERGIES:  He is allergic to PREVACID.  It caused a rash.   The patient has not had other abdominal operations.   REVIEW OF SYSTEMS:  He denies chest pain, shortness of breath and vomiting.  He had a little  bit of nausea. No diarrhea. He has not had a fever. He has  not had chills. No urinary symptoms. The remainder of the 15 point review of  systems is unremarkable.   FAMILY HISTORY/CHILDHOOD ILLNESSES:  Unremarkable.   SOCIAL HISTORY:  No smoking or drinking.   PHYSICAL EXAMINATION:  GENERAL:  A healthy-appearing man in no acute  distress. Not overweight.  VITAL SIGNS:  Unremarkable as recorded by nursing staff.  HEAD/NECK:  No abnormalities noted except for a well-healed incision of the  left lower anterior neck.  CHEST:  Clear to auscultation. No chest wall tenderness.  HEART:  Rate and rhythm normal. No murmur or gallop.  ABDOMEN:  Well-healed upper midline incision. Bowel sounds present. Tender  in the right mid abdomen and right lower quadrant. No masses noted.  GENITALIA:  Normal male.  RECTAL:  Not performed.  EXTREMITIES:  No edema. Good pulses. No lesions.  SKIN:  No lesions are noted.  LYMPHATIC:  Lymph nodes not enlarged in the groin, axilla or neck.  NEUROLOGICAL:  Grossly completely normal.   IMPRESSION:  1.  Appendicitis versus acute cholecystitis.  2.  Recent transhiatal esophagectomy with pull-through.  3.  History of asthma.  4.  History of cancer of the esophagus.   PLAN:  Coverage with antibiotics and urgent the laparoscopy with  appendectomy and/or cholecystectomy.      Lebron Conners, M.D.  Electronically Signed     WB/MEDQ  D:  06/24/2005  T:  06/24/2005  Job:  [id number]

## 2010-09-13 NOTE — Op Note (Signed)
Peter Richmond, Peter Richmond               ACCOUNT NO.:  [id number]   MEDICAL RECORD NO.:  [id number]          PATIENT TYPE:  AMB   LOCATION:  ENDO                         FACILITY:  [facility]   PHYSICIAN:  Georgiana Spinner, M.D.    DATE OF BIRTH:  [birth year]   DATE OF PROCEDURE:  02/17/2005  DATE OF DISCHARGE:                                 OPERATIVE REPORT   PROCEDURE:  Upper endoscopy.   INDICATIONS FOR PROCEDURE:  Barrett's esophagus with low grade dysplasia.  Patient now on Nexium b.i.d.   ANESTHESIA:  Demerol 50 mg, Versed 5 mg.   DESCRIPTION OF PROCEDURE:  With the patient mildly sedated in the left  lateral decubitus position, the Olympus videoscopic endoscope was inserted  in the mouth and passed under direct vision through the esophagus which  appeared normal until we reached the distal esophagus and there was clear  cut Barrett's and a polyp that was seen. This was photographed. We entered  into the stomach. The fundus, body, antrum, duodenal bulb and second portion  of the duodenum were visualized. From this point, the endoscope was slowly  withdrawn taking circumferential views of the duodenal mucosa until the  endoscope had been pulled back into the stomach, placed in retroflexion to  view the stomach from below. The endoscope was then straightened and  withdrawn taking circumferential views of the remaining gastric and  esophageal mucosa stopping in the area of the Barrett's esophagus to  rephotograph and biopsy the polyp and the Barrett's separately. We removed  most of the polyp and suctioned it into the tip of the endoscope and then  withdrew it and then reinserted to finish the biopsies.  The patient's vital  signs and pulse oximeter remained stable. The patient tolerated the  procedure well without apparent complications.   FINDINGS:  Barrett's esophagus sampled in four quadrants. Polyp of distal  esophagus and area of Barrett's raises concern for malignancy. Await biopsy  reports. The patient will call me for results and followup with me as an  outpatient.           ______________________________  Georgiana Spinner, M.D.     GMO/MEDQ  D:  02/17/2005  T:  02/17/2005  Job:  [id number]

## 2010-09-13 NOTE — Op Note (Signed)
Peter Richmond, Peter Richmond               ACCOUNT NO.:  [id number]   MEDICAL RECORD NO.:  [id number]          PATIENT TYPE:  AMB   LOCATION:  ENDO                         FACILITY:  [facility]   PHYSICIAN:  Georgiana Spinner, M.D.    DATE OF BIRTH:  [birth year]   DATE OF PROCEDURE:  07/23/2005  DATE OF DISCHARGE:                                 OPERATIVE REPORT   PROCEDURE:  Upper endoscopy with Savary dilation.   INDICATIONS:  Dysphagia post surgery.   ANESTHESIA:  Fentanyl 100 mcg, Versed 5 mg.   PROCEDURE:  With the patient mildly sedated in the left lateral decubitus  position with fluoroscopy provided in room 1 of endoscopy, the Olympus  videoscopic endoscope was inserted in the mouth and passed into the  esophagus which appeared normal until we reached the distal esophagus where  suture material was seen and photographed.  We entered through a slight  stricture into the stomach remnant.  This was viewed until we reached the  small intestine.  The endoscope was then pulled back into the stomach placed  in retroflexion to view the stomach from below.  The endoscope was then  straightened and a guidewire was passed.  The endoscope was withdrawn and it  was noted that the GE junction was approximately 25 cm from the incisors.  Subsequently with fluoroscopy provided, 14 and 15 Savary dilators were  passed. With the 14 there was no blood, with the 15 there was blood seen on  the dilator.  Therefore I elected to withdraw the guidewire at that point,  reinserted the endoscope and some blood was seen in the stomach emanating  from a mucosal tear that was done and accomplished the dilation at the  gastroesophageal junction.  I elected therefore to terminate the procedure  feeling that we had been successful in dilation.  The endoscope was  withdrawn.  The patient's vital signs, pulse oximeter remained stable.  The  patient tolerated procedure well without apparent complications.   FINDINGS:  Suture  material at 25 cm from the incisors. Otherwise an  unremarkable examination with dilation of the stricture to 14 and 15 Savary  dilation.   PLAN:  Will have the patient on liquid diet for 24 hours and advance as  tolerated and have patient follow-up with me as an outpatient.           ______________________________  Georgiana Spinner, M.D.     GMO/MEDQ  D:  07/23/2005  T:  07/24/2005  Job:  [id number]   cc:   Ines Bloomer, M.D.  [address]  [place]  [place] [address]

## 2010-09-13 NOTE — Op Note (Signed)
   NAMEANACLETO, Peter Richmond                           ACCOUNT NO.:  [id number]   MEDICAL RECORD NO.:  [id number]                   PATIENT TYPE:  AMB   LOCATION:  ENDO                                 FACILITY:  [facility]   PHYSICIAN:  Georgiana Spinner, M.D.                 DATE OF BIRTH:  [birth year]   DATE OF PROCEDURE:  07/07/2002  DATE OF DISCHARGE:                                 OPERATIVE REPORT   PROCEDURE:  Upper endoscopy with biopsy.   INDICATIONS:  Barrett's esophagus.   ANESTHESIA:  Demerol 70 mg, Versed 7 mg.   DESCRIPTION OF PROCEDURE:  With the patient mildly sedated in the left  lateral decubitus position, the Olympus videoscopic endoscope was inserted  in the mouth and passed under direct vision through the esophagus, where  Barrett's esophagus was seen, photographed, and biopsied.  We entered into  the stomach through a hiatal hernia.  Fundus, body, antrum, duodenal bulb,  and second portion of the duodenum all appeared normal.  From this point the  endoscope was slowly withdrawn, taking circumferential views of the entire  duodenal mucosa until the endoscope had been pulled back into the stomach,  placed in retroflexion to view the stomach from below.  The endoscope was  straightened and withdrawn, taking circumferential views of the remaining  gastric and esophageal mucosa.  The patient's vital signs and pulse oximetry  remained stable.  The patient tolerated the procedure well without apparent  complications.   FINDINGS:  Barrett's esophagus, biopsied.   PLAN:  Await biopsy report.  The patient will call me for results and follow  up with me as an outpatient.                                               Georgiana Spinner, M.D.    GMO/MEDQ  D:  07/07/2002  T:  07/07/2002  Job:  [id number]

## 2010-09-13 NOTE — Op Note (Signed)
NAME:  Peter Richmond, Peter Richmond                         ACCOUNT NO.:  [id number]   MEDICAL RECORD NO.:  [id number]                   PATIENT TYPE:  AMB   LOCATION:  ENDO                                 [facility]ACILITY:  [facility]   PHYSICIAN:  Georgiana Spinner, M.D.                 DATE OF BIRTH:  10-Nov-1931   DATE OF PROCEDURE:  DATE OF DISCHARGE:                                 OPERATIVE REPORT   PROCEDURE:  Upper endoscopy with biopsy.   INDICATIONS FOR PROCEDURE:  Barrett's esophagus.   ANESTHESIA:  Demerol 60, Versed 6 mg.   DESCRIPTION OF PROCEDURE:  With the patient mildly sedated in the left  lateral decubitus position, the Olympus videoscopic endoscope was inserted  in the mouth and passed under direct vision through the esophagus which  appeared normal until we reached the distal esophagus and there were clear  cut areas of Barrett's esophagus seen, photographed and biopsied.  The  endoscope was then advanced into the stomach, the fundus, body, antrum,  duodenal bulb, and second portion of the duodenum all appeared normal.  From  this point, the endoscope was slowly withdrawn taking circumferential views  of the duodenal mucosa until the endoscope was then pulled back in the  stomach, placed in retroflexion to view the stomach from below and a hiatal  hernia was seen. The endoscope was straightened and withdrawn taking  circumferential views of the remaining gastric and esophageal mucosa. The  patient's vital signs and pulse oximeter remained stable. The patient  tolerated the procedure well without apparent complications.   FINDINGS:  Barrett's esophagus above a hiatal hernia biopsied, await biopsy  report. The patient will call me for results and followup with me as an  outpatient.                                               Georgiana Spinner, M.D.    GMO/MEDQ  D:  11/08/2003  T:  11/08/2003  Job:  [id number]

## 2010-09-13 NOTE — Procedures (Signed)
[facility]  Patient:    Peter Richmond, OBST Visit Number: [id number] MRN: [id number]          Service Type: Attending:  Sabino Gasser, M.D. Dictated by:   Sabino Gasser, M.D. Adm. Date:  01/03/01                             Procedure Report  PROCEDURE:  Upper endoscopy.  INDICATIONS:  Gastroesophageal reflux disease.  ANESTHESIA:  Demerol 50, Versed 7.5 mg.  DESCRIPTION OF PROCEDURE:  With the patient mildly sedate in the left lateral decubitus position, the Olympus videoscopic endoscope was inserted in the mouth, passed under direct vision through the esophagus.  Distal esophagus was approached and showed multiple areas compatible with Barretts esophagus. Photographs and biopsies were taken of these areas.  We entered into the stomach.  Fundus, body, antrum, duodenal bulb, second portion duodenum were visualized.  From this point, the endoscope was slowly withdrawn, taking circumferential views of the entire duodenal mucosa until the endoscope had been pulled back into the stomach, placed in retroflexion view of the stomach from below.  Hiatal hernia was visualized from below and photographed.  The endoscope was straightened and withdrawn, taking circumferential views of the entire gastric and esophageal mucosa, stopping in the antrum of the stomach where diffuse erythema consistent with gastritis was photographed and biopsied.  Once accomplished, the endoscope was withdrawn.  The patients vital signs, pulse oximeter range stable.  The patient tolerated the procedure well without apparent complications.  FINDINGS: 1. Clear-cut Barretts esophagus. 2. Question of gastritis of the antrum.  PLAN:  Await biopsy report.  The patient will call me for results and follow up with me as an outpatient.  Proceed to colonoscopy as planned. Dictated by:   Sabino Gasser, M.D. Attending:  Sabino Gasser, M.D. DD:  01/13/01 TD:  01/13/01 Job: [id number]

## 2011-07-28 DIAGNOSIS — Z79899 Other long term (current) drug therapy: Secondary | ICD-10-CM | POA: Diagnosis not present

## 2011-07-28 DIAGNOSIS — Z0000 Encounter for general adult medical examination without abnormal findings: Secondary | ICD-10-CM | POA: Diagnosis not present

## 2011-07-28 DIAGNOSIS — Z125 Encounter for screening for malignant neoplasm of prostate: Secondary | ICD-10-CM | POA: Diagnosis not present

## 2011-07-28 DIAGNOSIS — G609 Hereditary and idiopathic neuropathy, unspecified: Secondary | ICD-10-CM | POA: Diagnosis not present

## 2011-07-31 DIAGNOSIS — G609 Hereditary and idiopathic neuropathy, unspecified: Secondary | ICD-10-CM | POA: Diagnosis not present

## 2011-07-31 DIAGNOSIS — K219 Gastro-esophageal reflux disease without esophagitis: Secondary | ICD-10-CM | POA: Diagnosis not present

## 2011-07-31 DIAGNOSIS — I10 Essential (primary) hypertension: Secondary | ICD-10-CM | POA: Diagnosis not present

## 2011-07-31 DIAGNOSIS — Z0000 Encounter for general adult medical examination without abnormal findings: Secondary | ICD-10-CM | POA: Diagnosis not present

## 2011-07-31 DIAGNOSIS — J309 Allergic rhinitis, unspecified: Secondary | ICD-10-CM | POA: Diagnosis not present

## 2011-08-12 DIAGNOSIS — K644 Residual hemorrhoidal skin tags: Secondary | ICD-10-CM | POA: Diagnosis not present

## 2011-08-12 DIAGNOSIS — K5730 Diverticulosis of large intestine without perforation or abscess without bleeding: Secondary | ICD-10-CM | POA: Diagnosis not present

## 2011-08-12 DIAGNOSIS — K2270 Barrett's esophagus without dysplasia: Secondary | ICD-10-CM | POA: Diagnosis not present

## 2011-08-12 DIAGNOSIS — Z1211 Encounter for screening for malignant neoplasm of colon: Secondary | ICD-10-CM | POA: Diagnosis not present

## 2011-08-12 DIAGNOSIS — Z8501 Personal history of malignant neoplasm of esophagus: Secondary | ICD-10-CM | POA: Diagnosis not present

## 2011-08-12 DIAGNOSIS — K648 Other hemorrhoids: Secondary | ICD-10-CM | POA: Diagnosis not present

## 2011-09-18 ENCOUNTER — Encounter: Payer: Medicare Other | Attending: Internal Medicine | Admitting: *Deleted

## 2011-09-18 ENCOUNTER — Encounter: Payer: Self-pay | Admitting: *Deleted

## 2011-09-18 DIAGNOSIS — R7309 Other abnormal glucose: Secondary | ICD-10-CM | POA: Diagnosis not present

## 2011-09-18 DIAGNOSIS — Z713 Dietary counseling and surveillance: Secondary | ICD-10-CM | POA: Diagnosis not present

## 2011-09-18 NOTE — Patient Instructions (Addendum)
Plan: Aim for 3 Carbs per meal (45  grams), +/- one either way and 0-2 Carbs per snack as needed Continue with your current activity level Read food labels for Total Carbohydrate of foods in your own home

## 2011-09-18 NOTE — Progress Notes (Signed)
  Medical Nutrition Therapy:  Appt start time: 1145 end time:  1245.  Assessment:  Primary concerns today: patient here for education on Pre-diabetes with his wife who appears supportive. He has history of 85 % of his esophagus removed due to cancer and states he avoids salt due to vertigo. He is physically active with yard work and golf and walks 3 days a week.   MEDICATIONS: see list   DIETARY INTAKE:  Usual eating pattern includes 3 meals and 1-2 snacks per day.  Everyday foods include good variety of all food groups.  Avoided foods include fried and high calorie foods.    24-hr recall:  B ( AM): sips of diet soda to calm stomach, then banana, 2 toast with PNB OR cereal with 1% milk, water Snk ( AM): none  L ( PM): lean meat, cheese, salsa with chips, ice cream 1-2 scoops Snk ( PM): occasionally crackers and PNB OR apple OR chex mix OR popsicle D ( PM): wife cooks supper; lean meat, vegetables x 1-2, occasional starch, OR pasta mixed with vegetables, water  Snk ( PM): nuts, OR popsicles,  Beverages: water, diet soda  Usual physical activity: yard work, golf once a week, walk once or twice a week in neighborhood  Estimated energy needs: 2000 calories 225 g carbohydrates 150 g protein 56 g fat  Progress Towards Goal(s):  In progress.   Nutritional Diagnosis:  NB-1.1 Food and nutrition-related knowledge deficit As related to pre-diabetes.  As evidenced by A1c of 6.2%.    Intervention:  Nutrition counseling and diabetes education provided. Discussed basic physiology of diabetes, carb counting and reading food labels. Plan: Aim for 3 Carbs per meal (45  grams), +/- one either way and 0-2 Carbs per snack as needed Continue with your current activity level Read food labels for Total Carbohydrate of foods in your own home  Handouts given during visit include: Living Well with Diabetes Carb Counting and Food Label handouts Meal Plan Card  Monitoring/Evaluation:  Dietary intake,  exercise, reading food labels, and body weight prn.

## 2012-01-07 DIAGNOSIS — Z23 Encounter for immunization: Secondary | ICD-10-CM | POA: Diagnosis not present

## 2012-02-03 DIAGNOSIS — R7309 Other abnormal glucose: Secondary | ICD-10-CM | POA: Diagnosis not present

## 2012-02-05 DIAGNOSIS — L570 Actinic keratosis: Secondary | ICD-10-CM | POA: Diagnosis not present

## 2012-02-05 DIAGNOSIS — K219 Gastro-esophageal reflux disease without esophagitis: Secondary | ICD-10-CM | POA: Diagnosis not present

## 2012-02-05 DIAGNOSIS — R7309 Other abnormal glucose: Secondary | ICD-10-CM | POA: Diagnosis not present

## 2012-02-05 DIAGNOSIS — R42 Dizziness and giddiness: Secondary | ICD-10-CM | POA: Diagnosis not present

## 2012-07-22 DIAGNOSIS — Z961 Presence of intraocular lens: Secondary | ICD-10-CM | POA: Diagnosis not present

## 2012-07-22 DIAGNOSIS — H5210 Myopia, unspecified eye: Secondary | ICD-10-CM | POA: Diagnosis not present

## 2012-07-22 DIAGNOSIS — H25099 Other age-related incipient cataract, unspecified eye: Secondary | ICD-10-CM | POA: Diagnosis not present

## 2012-07-22 DIAGNOSIS — H52229 Regular astigmatism, unspecified eye: Secondary | ICD-10-CM | POA: Diagnosis not present

## 2012-08-11 DIAGNOSIS — R42 Dizziness and giddiness: Secondary | ICD-10-CM | POA: Diagnosis not present

## 2012-08-11 DIAGNOSIS — M79609 Pain in unspecified limb: Secondary | ICD-10-CM | POA: Diagnosis not present

## 2012-10-28 DIAGNOSIS — I059 Rheumatic mitral valve disease, unspecified: Secondary | ICD-10-CM | POA: Diagnosis not present

## 2012-10-28 DIAGNOSIS — Z0000 Encounter for general adult medical examination without abnormal findings: Secondary | ICD-10-CM | POA: Diagnosis not present

## 2012-10-28 DIAGNOSIS — I10 Essential (primary) hypertension: Secondary | ICD-10-CM | POA: Diagnosis not present

## 2012-10-28 DIAGNOSIS — K219 Gastro-esophageal reflux disease without esophagitis: Secondary | ICD-10-CM | POA: Diagnosis not present

## 2012-10-28 DIAGNOSIS — Z006 Encounter for examination for normal comparison and control in clinical research program: Secondary | ICD-10-CM | POA: Diagnosis not present

## 2012-10-28 DIAGNOSIS — Z125 Encounter for screening for malignant neoplasm of prostate: Secondary | ICD-10-CM | POA: Diagnosis not present

## 2012-10-28 DIAGNOSIS — R7309 Other abnormal glucose: Secondary | ICD-10-CM | POA: Diagnosis not present

## 2012-11-03 DIAGNOSIS — R7309 Other abnormal glucose: Secondary | ICD-10-CM | POA: Diagnosis not present

## 2012-11-03 DIAGNOSIS — K219 Gastro-esophageal reflux disease without esophagitis: Secondary | ICD-10-CM | POA: Diagnosis not present

## 2012-11-03 DIAGNOSIS — Z1212 Encounter for screening for malignant neoplasm of rectum: Secondary | ICD-10-CM | POA: Diagnosis not present

## 2012-11-03 DIAGNOSIS — I059 Rheumatic mitral valve disease, unspecified: Secondary | ICD-10-CM | POA: Diagnosis not present

## 2012-11-03 DIAGNOSIS — Z87898 Personal history of other specified conditions: Secondary | ICD-10-CM | POA: Diagnosis not present

## 2012-11-03 DIAGNOSIS — I4510 Unspecified right bundle-branch block: Secondary | ICD-10-CM | POA: Diagnosis not present

## 2012-11-16 DIAGNOSIS — M545 Low back pain, unspecified: Secondary | ICD-10-CM | POA: Diagnosis not present

## 2012-11-16 DIAGNOSIS — N200 Calculus of kidney: Secondary | ICD-10-CM | POA: Diagnosis not present

## 2012-12-02 DIAGNOSIS — N201 Calculus of ureter: Secondary | ICD-10-CM | POA: Diagnosis not present

## 2012-12-02 DIAGNOSIS — N200 Calculus of kidney: Secondary | ICD-10-CM | POA: Diagnosis not present

## 2012-12-02 DIAGNOSIS — N281 Cyst of kidney, acquired: Secondary | ICD-10-CM | POA: Diagnosis not present

## 2012-12-07 DIAGNOSIS — N281 Cyst of kidney, acquired: Secondary | ICD-10-CM | POA: Diagnosis not present

## 2012-12-07 DIAGNOSIS — N200 Calculus of kidney: Secondary | ICD-10-CM | POA: Diagnosis not present

## 2012-12-09 ENCOUNTER — Other Ambulatory Visit: Payer: Self-pay | Admitting: Urology

## 2012-12-13 DIAGNOSIS — L259 Unspecified contact dermatitis, unspecified cause: Secondary | ICD-10-CM | POA: Diagnosis not present

## 2012-12-20 ENCOUNTER — Encounter (HOSPITAL_COMMUNITY): Payer: Self-pay | Admitting: Pharmacy Technician

## 2012-12-30 ENCOUNTER — Encounter (HOSPITAL_BASED_OUTPATIENT_CLINIC_OR_DEPARTMENT_OTHER): Payer: Self-pay | Admitting: *Deleted

## 2012-12-31 ENCOUNTER — Encounter (HOSPITAL_BASED_OUTPATIENT_CLINIC_OR_DEPARTMENT_OTHER): Payer: Self-pay | Admitting: *Deleted

## 2012-12-31 NOTE — Progress Notes (Signed)
NPO AFTER MN. ARRIVES AT 0600. NEEDS HG AND CXR. PT IS TO GO TO SHORT STAY AFTER PACU . HAS ESWL AT 1115.

## 2013-01-06 ENCOUNTER — Encounter (HOSPITAL_BASED_OUTPATIENT_CLINIC_OR_DEPARTMENT_OTHER): Payer: Self-pay | Admitting: *Deleted

## 2013-01-07 NOTE — Progress Notes (Signed)
Spoke to [employer] RN at [employer]. [facility] where patient is having surgery prior to Lithotripsy on Monday. She asked Dr. Vernie Ammons about pre litho KUB and MD stated it should be done pre op and that would be sufficient for the pre - litho KUB.

## 2013-01-07 NOTE — Progress Notes (Signed)
Spoke with Dr Vernie Ammons regarding KUB -plan to complete pre cystoscopy procedure at [facility].

## 2013-01-09 DIAGNOSIS — N200 Calculus of kidney: Secondary | ICD-10-CM | POA: Diagnosis present

## 2013-01-09 NOTE — H&P (Signed)
History of Present Illness               He experienced acute onset left lower back pain associated with diaphoresis it lasted about 10 minutes. He was evaluated and found to have blood by dipstick. He has no history of kidney stones but his father has had stones.  A CT scan done in 5/08 revealed a single right lower pole renal calculus. There was also a simple cyst seen in the left kidney. Since that time he has not had any further pain. He was given a urine strainer and passed what he thought was a small stone.   Interval history: No new complaints are noted today.   Past Medical History Problems  1. History of  Asthma 493.90 2. History of  Dupuytren's Contracture 728.6 3. History of  Esophageal Cancer V10.03 4. History of  Esophageal Reflux 530.81 5. History of  Lower Back Pain Chronic 6. History of  Prediabetes 790.29 7. 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 History of  Tinnitus 388.30 9. History of  Vertigo 780.4  Current Meds 1. Carisoprodol 350 MG Oral Tablet; Therapy: 22Apr2014 to 2. Fish Oil Concentrate 1000 MG Oral Capsule; Therapy: (Recorded:07Aug2014) to 3. Meloxicam 7.5 MG Oral Tablet; Therapy: (Recorded:07Aug2014) to 4. Multi-Vitamin TABS; Therapy: (Recorded:07Aug2014) to 5. Omeprazole 20 MG Oral Capsule Delayed Release; Therapy: 30Nov2013 to 6. Vitamin D 1000 UNIT Oral Tablet; Therapy: (Recorded:07Aug2014) to  Allergies Medication  1. Prevacid SoluTab TBDP  Family History Problems  1. Paternal history of  Pancreatic Neoplasm  Social History Problems  1. Never A Smoker  Review of Systems Genitourinary, constitutional, skin, eye, otolaryngeal, hematologic/lymphatic, cardiovascular, pulmonary, endocrine, musculoskeletal, gastrointestinal, neurological and psychiatric system(s) were reviewed and pertinent findings if present are noted.  Genitourinary: dysuria.    Vitals Vital Signs Blood Pressure: 93 / 57 Heart Rate: 69 BMI  Calculated: 23.25 BSA Calculated: 2.12 Height: 6 ft 3 in Weight: 185 lb   Physical Exam Constitutional: Well nourished and well developed . No acute distress.  ENT:. The ears and nose are normal in appearance.  Neck: The appearance of the neck is normal and no neck mass is present.  Pulmonary: No respiratory distress and normal respiratory rhythm and effort.  Cardiovascular: Heart rate and rhythm are normal . No peripheral edema.  Abdomen: The abdomen is soft and nontender. No masses are palpated. No CVA tenderness. No hernias are palpable. No hepatosplenomegaly noted.  Rectal: Rectal exam demonstrates normal sphincter tone, no tenderness and no masses. The prostate has no nodularity and is not tender. The left seminal vesicle is nonpalpable. The right seminal vesicle is nonpalpable. The perineum is normal on inspection.  Genitourinary: Examination of the penis demonstrates no discharge, no masses, no lesions and a normal meatus. The scrotum is without lesions. The right epididymis is palpably normal and non-tender. The left epididymis is palpably normal and non-tender. The right testis is non-tender and without masses. The left testis is non-tender and without masses.  Lymphatics: The femoral and inguinal nodes are not enlarged or tender.  Skin: Normal skin turgor, no visible rash and no visible skin lesions.  Neuro/Psych:. Mood and affect are appropriate.      Assessment Assessed  1. Nephrolithiasis Of The Right Kidney 592.0      I went over the results of the CT scan with him today.  It has confirmed the 17 x 10 mm calcification on the right hand side is a stone located in the renal  pelvis with Hounsfield units of approximately 1300.  Left renal cyst was again noted. We discussed the fact that this stone on the right hand side is fairly significant in size and therefore the treatment options would include ureteroscopy with laser lithotripsy although this would almost certainly require more  than one surgery to completely clear him of all stone.  The second option would be a percutaneous nephrolithotomy.  The third option would be lithotripsy with or possibly without preoperative stent although I have recommended that a stent be placed prior to lithotripsy as he may require more than one treatment as well. After discussing all of these options he has elected to proceed with lithotripsy.  I went over this procedure with him and we discussed the risks and complications as well as outpatient nature of the procedure with stent placement prior to the procedure.  He understands, I have answered all of his questions to his satisfaction, and he has elected to proceed.   Plan Nephrolithiasis Of The Right Kidney (592.0)    He will be scheduled for lithotripsy of his right renal pelvic stone with stent placement the morning of his procedure.

## 2013-01-10 ENCOUNTER — Encounter (HOSPITAL_BASED_OUTPATIENT_CLINIC_OR_DEPARTMENT_OTHER): Payer: Self-pay | Admitting: Anesthesiology

## 2013-01-10 ENCOUNTER — Ambulatory Visit (HOSPITAL_BASED_OUTPATIENT_CLINIC_OR_DEPARTMENT_OTHER)
Admission: RE | Admit: 2013-01-10 | Discharge: 2013-01-10 | Disposition: A | Discharge status: Home / Self Care | Payer: Medicare Other | Source: Ambulatory Visit | Attending: Urology | Admitting: Urology

## 2013-01-10 ENCOUNTER — Ambulatory Visit (HOSPITAL_COMMUNITY): Payer: Medicare Other

## 2013-01-10 ENCOUNTER — Encounter (HOSPITAL_COMMUNITY)
Admission: RE | Disposition: A | Discharge status: Home / Self Care | Payer: Self-pay | Source: Ambulatory Visit | Attending: Urology

## 2013-01-10 ENCOUNTER — Encounter (HOSPITAL_BASED_OUTPATIENT_CLINIC_OR_DEPARTMENT_OTHER): Payer: Self-pay | Admitting: *Deleted

## 2013-01-10 ENCOUNTER — Ambulatory Visit (HOSPITAL_BASED_OUTPATIENT_CLINIC_OR_DEPARTMENT_OTHER): Payer: Medicare Other | Admitting: Anesthesiology

## 2013-01-10 DIAGNOSIS — M720 Palmar fascial fibromatosis [Dupuytren]: Secondary | ICD-10-CM | POA: Diagnosis not present

## 2013-01-10 DIAGNOSIS — M545 Low back pain, unspecified: Secondary | ICD-10-CM | POA: Insufficient documentation

## 2013-01-10 DIAGNOSIS — J45909 Unspecified asthma, uncomplicated: Secondary | ICD-10-CM | POA: Diagnosis not present

## 2013-01-10 DIAGNOSIS — H9319 Tinnitus, unspecified ear: Secondary | ICD-10-CM | POA: Insufficient documentation

## 2013-01-10 DIAGNOSIS — R7309 Other abnormal glucose: Secondary | ICD-10-CM | POA: Diagnosis not present

## 2013-01-10 DIAGNOSIS — K219 Gastro-esophageal reflux disease without esophagitis: Secondary | ICD-10-CM | POA: Diagnosis not present

## 2013-01-10 DIAGNOSIS — Z79899 Other long term (current) drug therapy: Secondary | ICD-10-CM | POA: Diagnosis not present

## 2013-01-10 DIAGNOSIS — Z8501 Personal history of malignant neoplasm of esophagus: Secondary | ICD-10-CM | POA: Diagnosis not present

## 2013-01-10 DIAGNOSIS — Z01818 Encounter for other preprocedural examination: Secondary | ICD-10-CM | POA: Diagnosis not present

## 2013-01-10 DIAGNOSIS — N281 Cyst of kidney, acquired: Secondary | ICD-10-CM | POA: Diagnosis not present

## 2013-01-10 DIAGNOSIS — N200 Calculus of kidney: Secondary | ICD-10-CM | POA: Diagnosis not present

## 2013-01-10 HISTORY — DX: Personal history of malignant neoplasm of esophagus: Z85.01

## 2013-01-10 HISTORY — DX: Calculus of kidney: N20.0

## 2013-01-10 HISTORY — DX: Gastro-esophageal reflux disease without esophagitis: K21.9

## 2013-01-10 HISTORY — PX: CYSTOSCOPY W/ URETERAL STENT PLACEMENT: SHX1429

## 2013-01-10 LAB — POCT HEMOGLOBIN-HEMACUE: Hemoglobin: 13.3 g/dL (ref 13.0–17.0)

## 2013-01-10 SURGERY — LITHOTRIPSY, ESWL
Anesthesia: LOCAL | Laterality: Right

## 2013-01-10 SURGERY — CYSTOSCOPY, WITH RETROGRADE PYELOGRAM AND URETERAL STENT INSERTION
Anesthesia: General | Site: Ureter | Laterality: Right | Wound class: Clean Contaminated

## 2013-01-10 MED ORDER — HYDROCODONE-ACETAMINOPHEN 10-325 MG PO TABS: 1.0000 | ORAL_TABLET | Freq: Four times a day (QID) | ORAL | Status: DC | PRN

## 2013-01-10 MED ORDER — SODIUM CHLORIDE 0.9 % IV SOLN
INTRAVENOUS | Status: DC
  Administered 2013-01-10: 11:00:00 via INTRAVENOUS

## 2013-01-10 MED ORDER — LACTATED RINGERS IV SOLN
INTRAVENOUS | Status: DC
  Administered 2013-01-10: 07:00:00 via INTRAVENOUS
  Filled 2013-01-10: qty 1000

## 2013-01-10 MED ORDER — IOHEXOL 350 MG/ML SOLN
INTRAVENOUS | Status: DC | PRN
  Administered 2013-01-10: 9 mL via INTRAVENOUS

## 2013-01-10 MED ORDER — PROPOFOL 10 MG/ML IV BOLUS
INTRAVENOUS | Status: DC | PRN
  Administered 2013-01-10: 50 mg via INTRAVENOUS
  Administered 2013-01-10: 200 mg via INTRAVENOUS

## 2013-01-10 MED ORDER — OXYCODONE HCL 5 MG PO TABS
5.0000 mg | ORAL_TABLET | Freq: Once | ORAL | Status: DC | PRN
  Filled 2013-01-10: qty 1

## 2013-01-10 MED ORDER — MEPERIDINE HCL 25 MG/ML IJ SOLN
6.2500 mg | INTRAMUSCULAR | Status: DC | PRN
  Filled 2013-01-10: qty 1

## 2013-01-10 MED ORDER — FENTANYL CITRATE 0.05 MG/ML IJ SOLN
INTRAMUSCULAR | Status: DC | PRN
  Administered 2013-01-10: 25 ug via INTRAVENOUS
  Administered 2013-01-10: 50 ug via INTRAVENOUS

## 2013-01-10 MED ORDER — BELLADONNA ALKALOIDS-OPIUM 16.2-60 MG RE SUPP
RECTAL | Status: DC | PRN
  Administered 2013-01-10: 1 via RECTAL

## 2013-01-10 MED ORDER — DEXAMETHASONE SODIUM PHOSPHATE 4 MG/ML IJ SOLN
INTRAMUSCULAR | Status: DC | PRN
  Administered 2013-01-10: 8 mg via INTRAVENOUS

## 2013-01-10 MED ORDER — DIAZEPAM 5 MG PO TABS
10.0000 mg | ORAL_TABLET | ORAL | Status: AC
  Administered 2013-01-10: 10 mg via ORAL
  Filled 2013-01-10: qty 2

## 2013-01-10 MED ORDER — HYDROMORPHONE HCL PF 1 MG/ML IJ SOLN
0.2500 mg | INTRAMUSCULAR | Status: DC | PRN
  Filled 2013-01-10: qty 1

## 2013-01-10 MED ORDER — PHENAZOPYRIDINE HCL 200 MG PO TABS
200.0000 mg | ORAL_TABLET | Freq: Once | ORAL | Status: DC
  Filled 2013-01-10: qty 1

## 2013-01-10 MED ORDER — PROMETHAZINE HCL 25 MG/ML IJ SOLN
6.2500 mg | INTRAMUSCULAR | Status: DC | PRN
  Filled 2013-01-10: qty 1

## 2013-01-10 MED ORDER — ONDANSETRON HCL 4 MG/2ML IJ SOLN
INTRAMUSCULAR | Status: DC | PRN
  Administered 2013-01-10: 4 mg via INTRAVENOUS

## 2013-01-10 MED ORDER — TAMSULOSIN HCL 0.4 MG PO CAPS: 0.4000 mg | ORAL_CAPSULE | ORAL | Status: DC

## 2013-01-10 MED ORDER — LIDOCAINE HCL (CARDIAC) 20 MG/ML IV SOLN
INTRAVENOUS | Status: DC | PRN
  Administered 2013-01-10: 60 mg via INTRAVENOUS

## 2013-01-10 MED ORDER — OXYCODONE HCL 5 MG/5ML PO SOLN
5.0000 mg | Freq: Once | ORAL | Status: DC | PRN
  Filled 2013-01-10: qty 5

## 2013-01-10 MED ORDER — SODIUM CHLORIDE 0.9 % IR SOLN
Status: DC | PRN
  Administered 2013-01-10: 3000 mL

## 2013-01-10 MED ORDER — CIPROFLOXACIN IN D5W 400 MG/200ML IV SOLN
400.0000 mg | INTRAVENOUS | Status: AC
  Administered 2013-01-10: 400 mg via INTRAVENOUS
  Filled 2013-01-10: qty 200

## 2013-01-10 MED ORDER — DIPHENHYDRAMINE HCL 25 MG PO CAPS
25.0000 mg | ORAL_CAPSULE | ORAL | Status: AC
  Administered 2013-01-10: 25 mg via ORAL
  Filled 2013-01-10: qty 1

## 2013-01-10 SURGICAL SUPPLY — 20 items
ADAPTER CATH URET PLST 4-6FR (CATHETERS) IMPLANT
BAG DRAIN URO-CYSTO SKYTR STRL (DRAIN) ×2 IMPLANT
CANISTER SUCT LVC 12 LTR MEDI- (MISCELLANEOUS) ×2 IMPLANT
CATH INTERMIT  6FR 70CM (CATHETERS) IMPLANT
CATH URET 5FR 28IN CONE TIP (BALLOONS)
CATH URET 5FR 70CM CONE TIP (BALLOONS) IMPLANT
CLOTH BEACON ORANGE TIMEOUT ST (SAFETY) ×2 IMPLANT
DRAPE CAMERA CLOSED 9X96 (DRAPES) ×2 IMPLANT
GLOVE BIO SURGEON STRL SZ8 (GLOVE) ×2 IMPLANT
GOWN PREVENTION PLUS LG XLONG (DISPOSABLE) ×2 IMPLANT
GOWN STRL REIN XL XLG (GOWN DISPOSABLE) ×2 IMPLANT
GUIDEWIRE 0.038 PTFE COATED (WIRE) ×2 IMPLANT
GUIDEWIRE ANG ZIPWIRE 038X150 (WIRE) IMPLANT
GUIDEWIRE STR DUAL SENSOR (WIRE) ×2 IMPLANT
IV NS IRRIG 3000ML ARTHROMATIC (IV SOLUTION) ×4 IMPLANT
NS IRRIG 500ML POUR BTL (IV SOLUTION) IMPLANT
PACK CYSTOSCOPY (CUSTOM PROCEDURE TRAY) ×2 IMPLANT
STENT CONTOUR 6FRX24X.038 (STENTS) IMPLANT
STENT URET 6FRX24 CONTOUR (STENTS) ×2 IMPLANT
WATER STERILE IRR 3000ML UROMA (IV SOLUTION) IMPLANT

## 2013-01-10 NOTE — Op Note (Signed)
See [facility] OP note scanned into chart. 

## 2013-01-10 NOTE — Anesthesia Postprocedure Evaluation (Signed)
Anesthesia Post Note  Patient: Peter Richmond  Procedure(s) Performed: Procedure(s) (LRB): CYSTOSCOPY WITH RETROGRADE PYELOGRAM/URETERAL STENT PLACEMENT (Right)  Anesthesia type: General  Patient location: PACU  Post pain: Pain level controlled  Post assessment: Post-op Vital signs reviewed  Last Vitals: BP 138/67  Pulse 55  Temp(Src) 36.3 C (Oral)  Resp 13  Ht 6\' 3"  (1.905 m)  Wt 191 lb (86.637 kg)  BMI 23.87 kg/m2  SpO2 99%  Post vital signs: Reviewed  Level of consciousness: sedated  Complications: No apparent anesthesia complications

## 2013-01-10 NOTE — Op Note (Signed)
PATIENT:  Peter Richmond  Preoperative diagnosis:  1. Large right renal calculus  Postoperative diagnosis:  1. Same  Procedure:  1. Cystoscopy 2. Right ureteral stent placement (6 French, 24 cm)  3. Right retrograde pyelography with interpretation  Surgeon: Loraine Leriche C. Vernie Ammons, M.D.  Anesthesia: General  Complications: None  EBL: Minimal  Specimens: None  Indication: Mr. Marrow is a 79 year old male patient with a large right renal calculus measuring 1.7 cm.. After reviewing the management options for treatment, they have elected to proceed with the above surgical procedure(s). We have discussed the potential benefits and risks of the procedure, side effects of the proposed treatment, the likelihood of the patient achieving the goals of the procedure, and any potential problems that might occur during the procedure or recuperation. Informed consent has been obtained.  Description of procedure:  After informed consent was obtained the patient was taken to the operating room and general anesthesia was induced.  The patient was placed in the dorsal lithotomy position, prepped and draped in the usual sterile fashion, and preoperative antibiotics were administered. A preoperative time-out was performed.   Cystourethroscopy was performed.  The patient's urethra was examined and noted to be normal down to the sphincter. The prostatic urethra revealed bilobar hypertrophy with some elevation of the bladder neck but not a large median lobe. The bladder was then systematically examined in its entirety. There was no evidence for any bladder tumors, stones, or other mucosal pathology. Both ureteral orifices were noted to be of normal configuration and position.   Attention then turned to the right ureteral orifice and a 6 French open-end ureteral catheter was used to intubate the ureteral orifice.  Omnipaque contrast was injected through the ureteral catheter and a retrograde pyelogram was performed.  This revealed a normal ureter throughout its length without evidence of filling defect or mass effect. The intrarenal collecting system was noted to be normal with the previously noted stone present within the renal pelvis. No other abnormality was noted.  A 0.038 sensor guidewire was then advanced up the right ureter into the renal pelvis under fluoroscopic guidance.  The wire was then backloaded through the cystoscope and a ureteral stent was advance over the wire using Seldinger technique.  The stent was positioned appropriately under fluoroscopic and cystoscopic guidance.  The wire was then removed with an adequate stent curl noted in the renal pelvis as well as in the bladder.  The bladder was then emptied and the procedure ended.  The patient appeared to tolerate the procedure well and without complications.  The patient was able to be awakened and transferred to the recovery unit in satisfactory condition.

## 2013-01-10 NOTE — Anesthesia Preprocedure Evaluation (Addendum)
Anesthesia Evaluation  Patient identified by MRN, date of birth, ID band Patient awake    Reviewed: Allergy & Precautions, H&P , NPO status , Patient's Chart, lab work & pertinent test results  Airway Mallampati: I TM Distance: >3 FB Neck ROM: Full    Dental  (+) Dental Advisory Given and Teeth Intact   Pulmonary neg pulmonary ROS,  breath sounds clear to auscultation        Cardiovascular negative cardio ROS  Rhythm:Regular Rate:Normal     Neuro/Psych negative neurological ROS  negative psych ROS   GI/Hepatic Neg liver ROS, GERD-  Medicated,  Endo/Other  negative endocrine ROS  Renal/GU negative Renal ROS     Musculoskeletal negative musculoskeletal ROS (+)   Abdominal   Peds  Hematology negative hematology ROS (+)   Anesthesia Other Findings   Reproductive/Obstetrics                          Anesthesia Physical Anesthesia Plan  ASA: II  Anesthesia Plan: General   Post-op Pain Management:    Induction: Intravenous  Airway Management Planned: LMA  Additional Equipment:   Intra-op Plan:   Post-operative Plan: Extubation in OR  Informed Consent: I have reviewed the patients History and Physical, chart, labs and discussed the procedure including the risks, benefits and alternatives for the proposed anesthesia with the patient or authorized representative who has indicated his/her understanding and acceptance.   Dental advisory given  Plan Discussed with: CRNA  Anesthesia Plan Comments:        Anesthesia Quick Evaluation

## 2013-01-10 NOTE — Progress Notes (Signed)
Spoke with Dr. Vernie Ammons regarding pre-op meds ordered, Valium and benadryl.  Orders given to administer as ordered. See [facility]

## 2013-01-10 NOTE — Interval H&P Note (Signed)
History and Physical Interval Note:  01/10/2013 7:24 AM  Peter Richmond  has presented today for surgery, with the diagnosis of Right Renal Calculus  The various methods of treatment have been discussed with the patient and family. After consideration of risks, benefits and other options for treatment, the patient has consented to  Procedure(s): CYSTOSCOPY WITH RETROGRADE PYELOGRAM/URETERAL STENT PLACEMENT (Right) as a surgical intervention .  The patient's history has been reviewed, patient examined, no change in status, stable for surgery.  I have reviewed the patient's chart and labs.  Questions were answered to the patient's satisfaction.     [place]

## 2013-01-10 NOTE — Anesthesia Procedure Notes (Signed)
Procedure Name: LMA Insertion Date/Time: 01/10/2013 7:33 AM Performed by: Norva Pavlov Pre-anesthesia Checklist: Patient identified, Emergency Drugs available, Suction available and Patient being monitored Patient Re-evaluated:Patient Re-evaluated prior to inductionOxygen Delivery Method: Circle System Utilized Preoxygenation: Pre-oxygenation with 100% oxygen Intubation Type: IV induction Ventilation: Mask ventilation without difficulty LMA: LMA inserted LMA Size: 5.0 Number of attempts: 1 Airway Equipment and Method: bite block Placement Confirmation: positive ETCO2 Tube secured with: Tape Dental Injury: Teeth and Oropharynx as per pre-operative assessment

## 2013-01-10 NOTE — Transfer of Care (Signed)
Immediate Anesthesia Transfer of Care Note  Patient: Peter Richmond  Procedure(s) Performed: Procedure(s) (LRB): CYSTOSCOPY WITH RETROGRADE PYELOGRAM/URETERAL STENT PLACEMENT (Right)  Patient Location: PACU  Anesthesia Type: General  Level of Consciousness: awake, alert  and oriented  Airway & Oxygen Therapy: Patient Spontanous Breathing and Patient connected to face mask oxygen  Post-op Assessment: Report given to PACU RN and Post -op Vital signs reviewed and stable  Post vital signs: Reviewed and stable  Complications: No apparent anesthesia complications

## 2013-01-11 ENCOUNTER — Encounter (HOSPITAL_BASED_OUTPATIENT_CLINIC_OR_DEPARTMENT_OTHER): Payer: Self-pay | Admitting: Urology

## 2013-01-14 DIAGNOSIS — Z23 Encounter for immunization: Secondary | ICD-10-CM | POA: Diagnosis not present

## 2013-01-17 DIAGNOSIS — N390 Urinary tract infection, site not specified: Secondary | ICD-10-CM | POA: Diagnosis not present

## 2013-01-17 DIAGNOSIS — N200 Calculus of kidney: Secondary | ICD-10-CM | POA: Diagnosis not present

## 2013-01-17 DIAGNOSIS — N201 Calculus of ureter: Secondary | ICD-10-CM | POA: Diagnosis not present

## 2013-01-19 ENCOUNTER — Encounter (HOSPITAL_COMMUNITY): Payer: Self-pay | Admitting: *Deleted

## 2013-01-19 ENCOUNTER — Other Ambulatory Visit: Payer: Self-pay | Admitting: Urology

## 2013-01-20 ENCOUNTER — Encounter (HOSPITAL_COMMUNITY): Payer: Self-pay | Admitting: Pharmacy Technician

## 2013-01-23 NOTE — H&P (Signed)
Peter Richmond is a 79 year old male with right renal stones.   History of Present Illness        Calculus disease: He experienced acute onset left lower back pain associated with diaphoresis it lasted about 10 minutes. He was evaluated and found to have blood by dipstick. He has no history of kidney stones but his father has had stones.  A CT scan done in 5/08 revealed a single right lower pole renal calculus. CT scan done 12/07/12 revealed a 17 x 10 mm calcification on the right hand side is a stone located in the renal pelvis with Hounsfield units of approximately 1300. Treatment: Stent placement and ESL 01/10/13.  Left renal cyst: This is seen on CT scan done 5/08.  It was a simple cyst.   Interval history: He brought in what he thought was a small stone and it was sent for composition analysis which confirmed the passage of a recent stone as the analysis revealed the composition was calcium oxalate.  He has returned today status post stent placement and lithotripsy of his right renal calculus.  There appeared to be minimal fragmentation at the time of the procedure. She does report passing some small stone fragments beginning today. He's also having urgency and at times some urge incontinence.   Past Medical History Problems  1. History of  Asthma 493.90 2. History of  Dupuytren's Contracture 728.6 3. History of  Esophageal Cancer V10.03 4. History of  Esophageal Reflux 530.81 5. History of  Lower Back Pain Chronic 6. History of  Prediabetes 790.29 7. 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 History of  Tinnitus 388.30 9. History of  Vertigo 780.4  Current Meds 1. Carisoprodol 350 MG Oral Tablet; Therapy: 22Apr2014 to 2. Fish Oil Concentrate 1000 MG Oral Capsule; Therapy: (Recorded:07Aug2014) to 3. Meloxicam 7.5 MG Oral Tablet; Therapy: (Recorded:07Aug2014) to 4. Multi-Vitamin TABS; Therapy: (Recorded:07Aug2014) to 5. Omeprazole 20 MG Oral Capsule Delayed Release;  Therapy: 30Nov2013 to 6. Vitamin D 1000 UNIT Oral Tablet; Therapy: (Recorded:07Aug2014) to  Allergies Medication  1. Prevacid SoluTab TBDP  Family History Problems  1. Paternal history of  Pancreatic Neoplasm  Social History Problems  1. Never A Smoker  Vital Signs Blood Pressure: 130 / 81 Temperature: 97.1 F Heart Rate: 75   Review of Systems Genitourinary, constitutional, skin, eye, otolaryngeal, hematologic/lymphatic, cardiovascular, pulmonary, endocrine, musculoskeletal, gastrointestinal, neurological and psychiatric system(s) were reviewed and pertinent findings if present are noted.  Genitourinary: dysuria.  Physical Exam Constitutional: Well nourished and well developed . No acute distress.  ENT:. The ears and nose are normal in appearance.  Neck: The appearance of the neck is normal and no neck mass is present.  Pulmonary: No respiratory distress and normal respiratory rhythm and effort.  Cardiovascular: Heart rate and rhythm are normal . No peripheral edema.  Abdomen: The abdomen is soft and nontender. No masses are palpated. No CVA tenderness. No hernias are palpable. No hepatosplenomegaly noted.  Rectal: Rectal exam demonstrates normal sphincter tone, no tenderness and no masses. The prostate has no nodularity and is not tender. The left seminal vesicle is nonpalpable. The right seminal vesicle is nonpalpable. The perineum is normal on inspection.  Genitourinary: Examination of the penis demonstrates no discharge, no masses, no lesions and a normal meatus. The scrotum is without lesions. The right epididymis is palpably normal and non-tender. The left epididymis is palpably normal and non-tender. The right testis is non-tender and without masses. The left testis is non-tender and  without masses.  Lymphatics: The femoral and inguinal nodes are not enlarged or tender.  Skin: Normal skin turgor, no visible rash and no visible skin lesions.  Neuro/Psych:. Mood and affect  are appropriate.   Assessment  Assessed     We discussed the fact that his KUB reveals that there has been more fragmentation of his stone and then I thought occurred at the time of his lithotripsy. I told him that there were 3 options moving forward. If his stone had not fragmented at all my recommendation was going to be to proceed with a percutaneous nephrolithotomy. I did discuss this as one option that would very likely allow me to clear all of his stone in one setting however we discussed how the procedure was performed and it's more invasive nature and also the need for an overnight stay in the hospital. We then discussed ureteroscopy but I think his stone burden is still too great to consider this a viable option. Since there was fragmentation of the stone I told him that repeating his lithotripsy would be the least invasive and possibly most effective way of completely fragmenting the stone. I did however discuss the fact that this may not completely allow the stone to fragment all the way and he may require a third procedure be it a third lithotripsy procedure versus ureteroscopic management.  His urine appeared to possibly be infected and he was having some urgency with urge incontinence. I think this is most likely do to the presence of his stent however I told him I would culture his urine and in addition I have given him samples of Myrbetriq 25 mg and we discussed the side effects and potential for worsening of obstructive type symptoms and what to do if that should occur.   Plan      1. His urine was cultured and found to be negative. 2. I will schedule him for repeat lithotripsy of the remaining stone fragments in his right kidney.     3. Samples of Myrbetriq 25 mg given.

## 2013-01-24 ENCOUNTER — Encounter (HOSPITAL_COMMUNITY)
Admission: RE | Disposition: A | Discharge status: Home / Self Care | Payer: Self-pay | Source: Ambulatory Visit | Attending: Urology

## 2013-01-24 ENCOUNTER — Encounter (HOSPITAL_COMMUNITY): Payer: Self-pay | Admitting: *Deleted

## 2013-01-24 ENCOUNTER — Ambulatory Visit (HOSPITAL_COMMUNITY): Payer: Medicare Other

## 2013-01-24 ENCOUNTER — Ambulatory Visit (HOSPITAL_COMMUNITY)
Admission: RE | Admit: 2013-01-24 | Discharge: 2013-01-24 | Disposition: A | Discharge status: Home / Self Care | Payer: Medicare Other | Source: Ambulatory Visit | Attending: Urology | Admitting: Urology

## 2013-01-24 DIAGNOSIS — Z01818 Encounter for other preprocedural examination: Secondary | ICD-10-CM | POA: Diagnosis not present

## 2013-01-24 DIAGNOSIS — Z79899 Other long term (current) drug therapy: Secondary | ICD-10-CM | POA: Diagnosis not present

## 2013-01-24 DIAGNOSIS — Z538 Procedure and treatment not carried out for other reasons: Secondary | ICD-10-CM | POA: Diagnosis not present

## 2013-01-24 DIAGNOSIS — N200 Calculus of kidney: Secondary | ICD-10-CM | POA: Diagnosis not present

## 2013-01-24 SURGERY — LITHOTRIPSY, ESWL
Anesthesia: LOCAL | Laterality: Right

## 2013-01-24 MED ORDER — DIPHENHYDRAMINE HCL 25 MG PO CAPS
25.0000 mg | ORAL_CAPSULE | ORAL | Status: DC
  Filled 2013-01-24: qty 1

## 2013-01-24 MED ORDER — SODIUM CHLORIDE 0.9 % IV SOLN: INTRAVENOUS | Status: DC

## 2013-01-24 MED ORDER — DIAZEPAM 5 MG PO TABS
10.0000 mg | ORAL_TABLET | ORAL | Status: DC
  Filled 2013-01-24: qty 2

## 2013-01-24 MED ORDER — CIPROFLOXACIN HCL 500 MG PO TABS
500.0000 mg | ORAL_TABLET | ORAL | Status: DC
  Filled 2013-01-24: qty 1

## 2013-01-26 ENCOUNTER — Encounter (HOSPITAL_COMMUNITY): Payer: Self-pay | Admitting: *Deleted

## 2013-01-28 ENCOUNTER — Other Ambulatory Visit: Payer: Self-pay | Admitting: Urology

## 2013-01-31 ENCOUNTER — Encounter (HOSPITAL_COMMUNITY): Payer: Self-pay | Admitting: *Deleted

## 2013-01-31 ENCOUNTER — Ambulatory Visit (HOSPITAL_COMMUNITY)
Admission: RE | Admit: 2013-01-31 | Discharge: 2013-01-31 | Disposition: A | Discharge status: Home / Self Care | Payer: Medicare Other | Source: Ambulatory Visit | Attending: Urology | Admitting: Urology

## 2013-01-31 ENCOUNTER — Encounter (HOSPITAL_COMMUNITY)
Admission: RE | Disposition: A | Discharge status: Home / Self Care | Payer: Self-pay | Source: Ambulatory Visit | Attending: Urology

## 2013-01-31 ENCOUNTER — Ambulatory Visit (HOSPITAL_COMMUNITY): Payer: Medicare Other

## 2013-01-31 DIAGNOSIS — N200 Calculus of kidney: Secondary | ICD-10-CM | POA: Diagnosis present

## 2013-01-31 DIAGNOSIS — Z01818 Encounter for other preprocedural examination: Secondary | ICD-10-CM | POA: Diagnosis not present

## 2013-01-31 SURGERY — LITHOTRIPSY, ESWL
Anesthesia: LOCAL | Laterality: Right

## 2013-01-31 MED ORDER — DIPHENHYDRAMINE HCL 25 MG PO CAPS
25.0000 mg | ORAL_CAPSULE | ORAL | Status: AC
  Administered 2013-01-31: 25 mg via ORAL
  Filled 2013-01-31: qty 1

## 2013-01-31 MED ORDER — OXYCODONE-ACETAMINOPHEN 10-325 MG PO TABS: 1.0000 | ORAL_TABLET | ORAL | Status: DC | PRN

## 2013-01-31 MED ORDER — SODIUM CHLORIDE 0.9 % IV SOLN
INTRAVENOUS | Status: DC
  Administered 2013-01-31 (×2): via INTRAVENOUS

## 2013-01-31 MED ORDER — DIAZEPAM 5 MG PO TABS
10.0000 mg | ORAL_TABLET | ORAL | Status: AC
  Administered 2013-01-31: 10 mg via ORAL
  Filled 2013-01-31: qty 2

## 2013-01-31 MED ORDER — CIPROFLOXACIN HCL 500 MG PO TABS
500.0000 mg | ORAL_TABLET | ORAL | Status: AC
  Administered 2013-01-31: 500 mg via ORAL
  Filled 2013-01-31: qty 1

## 2013-01-31 NOTE — Op Note (Signed)
See [facility] OP note scanned into chart. 

## 2013-01-31 NOTE — H&P (View-Only) (Signed)
Mr. Plante is a 79 year old male with right renal stones.   History of Present Illness        Calculus disease: He experienced acute onset left lower back pain associated with diaphoresis it lasted about 10 minutes. He was evaluated and found to have blood by dipstick. He has no history of kidney stones but his father has had stones.  A CT scan done in 5/08 revealed a single right lower pole renal calculus. CT scan done 12/07/12 revealed a 17 x 10 mm calcification on the right hand side is a stone located in the renal pelvis with Hounsfield units of approximately 1300. Treatment: Stent placement and ESL 01/10/13.  Left renal cyst: This is seen on CT scan done 5/08.  It was a simple cyst.   Interval history: He brought in what he thought was a small stone and it was sent for composition analysis which confirmed the passage of a recent stone as the analysis revealed the composition was calcium oxalate.  He has returned today status post stent placement and lithotripsy of his right renal calculus.  There appeared to be minimal fragmentation at the time of the procedure. She does report passing some small stone fragments beginning today. He's also having urgency and at times some urge incontinence.   Past Medical History Problems  1. History of  Asthma 493.90 2. History of  Dupuytren's Contracture 728.6 3. History of  Esophageal Cancer V10.03 4. History of  Esophageal Reflux 530.81 5. History of  Lower Back Pain Chronic 6. History of  Prediabetes 790.29 7. 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 History of  Tinnitus 388.30 9. History of  Vertigo 780.4  Current Meds 1. Carisoprodol 350 MG Oral Tablet; Therapy: 22Apr2014 to 2. Fish Oil Concentrate 1000 MG Oral Capsule; Therapy: (Recorded:07Aug2014) to 3. Meloxicam 7.5 MG Oral Tablet; Therapy: (Recorded:07Aug2014) to 4. Multi-Vitamin TABS; Therapy: (Recorded:07Aug2014) to 5. Omeprazole 20 MG Oral Capsule Delayed Release;  Therapy: 30Nov2013 to 6. Vitamin D 1000 UNIT Oral Tablet; Therapy: (Recorded:07Aug2014) to  Allergies Medication  1. Prevacid SoluTab TBDP  Family History Problems  1. Paternal history of  Pancreatic Neoplasm  Social History Problems  1. Never A Smoker  Vital Signs Blood Pressure: 130 / 81 Temperature: 97.1 F Heart Rate: 75   Review of Systems Genitourinary, constitutional, skin, eye, otolaryngeal, hematologic/lymphatic, cardiovascular, pulmonary, endocrine, musculoskeletal, gastrointestinal, neurological and psychiatric system(s) were reviewed and pertinent findings if present are noted.  Genitourinary: dysuria.  Physical Exam Constitutional: Well nourished and well developed . No acute distress.  ENT:. The ears and nose are normal in appearance.  Neck: The appearance of the neck is normal and no neck mass is present.  Pulmonary: No respiratory distress and normal respiratory rhythm and effort.  Cardiovascular: Heart rate and rhythm are normal . No peripheral edema.  Abdomen: The abdomen is soft and nontender. No masses are palpated. No CVA tenderness. No hernias are palpable. No hepatosplenomegaly noted.  Rectal: Rectal exam demonstrates normal sphincter tone, no tenderness and no masses. The prostate has no nodularity and is not tender. The left seminal vesicle is nonpalpable. The right seminal vesicle is nonpalpable. The perineum is normal on inspection.  Genitourinary: Examination of the penis demonstrates no discharge, no masses, no lesions and a normal meatus. The scrotum is without lesions. The right epididymis is palpably normal and non-tender. The left epididymis is palpably normal and non-tender. The right testis is non-tender and without masses. The left testis is non-tender and  without masses.  Lymphatics: The femoral and inguinal nodes are not enlarged or tender.  Skin: Normal skin turgor, no visible rash and no visible skin lesions.  Neuro/Psych:. Mood and affect  are appropriate.   Assessment  Assessed     We discussed the fact that his KUB reveals that there has been more fragmentation of his stone and then I thought occurred at the time of his lithotripsy. I told him that there were 3 options moving forward. If his stone had not fragmented at all my recommendation was going to be to proceed with a percutaneous nephrolithotomy. I did discuss this as one option that would very likely allow me to clear all of his stone in one setting however we discussed how the procedure was performed and it's more invasive nature and also the need for an overnight stay in the hospital. We then discussed ureteroscopy but I think his stone burden is still too great to consider this a viable option. Since there was fragmentation of the stone I told him that repeating his lithotripsy would be the least invasive and possibly most effective way of completely fragmenting the stone. I did however discuss the fact that this may not completely allow the stone to fragment all the way and he may require a third procedure be it a third lithotripsy procedure versus ureteroscopic management.  His urine appeared to possibly be infected and he was having some urgency with urge incontinence. I think this is most likely do to the presence of his stent however I told him I would culture his urine and in addition I have given him samples of Myrbetriq 25 mg and we discussed the side effects and potential for worsening of obstructive type symptoms and what to do if that should occur.   Plan      1. His urine was cultured and found to be negative. 2. I will schedule him for repeat lithotripsy of the remaining stone fragments in his right kidney.     3. Samples of Myrbetriq 25 mg given.

## 2013-01-31 NOTE — Interval H&P Note (Signed)
History and Physical Interval Note:  01/31/2013 2:48 PM  Peter Richmond  has presented today for surgery, with the diagnosis of Right Renal Calculi  The various methods of treatment have been discussed with the patient and family. After consideration of risks, benefits and other options for treatment, the patient has consented to  Procedure(s): RIGHT EXTRACORPOREAL SHOCK WAVE LITHOTRIPSY (ESWL) (Right) as a surgical intervention .  The patient's history has been reviewed, patient examined, no change in status, stable for surgery.  I have reviewed the patient's chart and labs.  Questions were answered to the patient's satisfaction.     Garnett Farm

## 2013-02-07 DIAGNOSIS — N200 Calculus of kidney: Secondary | ICD-10-CM | POA: Diagnosis not present

## 2013-03-07 DIAGNOSIS — N200 Calculus of kidney: Secondary | ICD-10-CM | POA: Diagnosis not present

## 2013-06-09 DIAGNOSIS — M720 Palmar fascial fibromatosis [Dupuytren]: Secondary | ICD-10-CM | POA: Diagnosis not present

## 2013-06-16 DIAGNOSIS — M720 Palmar fascial fibromatosis [Dupuytren]: Secondary | ICD-10-CM | POA: Diagnosis not present

## 2013-07-11 DIAGNOSIS — N200 Calculus of kidney: Secondary | ICD-10-CM | POA: Diagnosis not present

## 2013-08-22 DIAGNOSIS — M720 Palmar fascial fibromatosis [Dupuytren]: Secondary | ICD-10-CM | POA: Diagnosis not present

## 2013-08-25 DIAGNOSIS — M720 Palmar fascial fibromatosis [Dupuytren]: Secondary | ICD-10-CM | POA: Diagnosis not present

## 2013-09-20 DIAGNOSIS — J069 Acute upper respiratory infection, unspecified: Secondary | ICD-10-CM | POA: Diagnosis not present

## 2013-09-22 DIAGNOSIS — H1089 Other conjunctivitis: Secondary | ICD-10-CM | POA: Diagnosis not present

## 2013-11-02 DIAGNOSIS — Z0000 Encounter for general adult medical examination without abnormal findings: Secondary | ICD-10-CM | POA: Diagnosis not present

## 2013-11-02 DIAGNOSIS — Z125 Encounter for screening for malignant neoplasm of prostate: Secondary | ICD-10-CM | POA: Diagnosis not present

## 2013-11-02 DIAGNOSIS — R7309 Other abnormal glucose: Secondary | ICD-10-CM | POA: Diagnosis not present

## 2013-11-02 DIAGNOSIS — K219 Gastro-esophageal reflux disease without esophagitis: Secondary | ICD-10-CM | POA: Diagnosis not present

## 2013-11-02 DIAGNOSIS — I10 Essential (primary) hypertension: Secondary | ICD-10-CM | POA: Diagnosis not present

## 2013-11-02 DIAGNOSIS — Z23 Encounter for immunization: Secondary | ICD-10-CM | POA: Diagnosis not present

## 2013-11-08 DIAGNOSIS — Z9089 Acquired absence of other organs: Secondary | ICD-10-CM | POA: Diagnosis not present

## 2013-11-08 DIAGNOSIS — R110 Nausea: Secondary | ICD-10-CM | POA: Diagnosis not present

## 2013-11-08 DIAGNOSIS — I4510 Unspecified right bundle-branch block: Secondary | ICD-10-CM | POA: Diagnosis not present

## 2013-11-08 DIAGNOSIS — Z1212 Encounter for screening for malignant neoplasm of rectum: Secondary | ICD-10-CM | POA: Diagnosis not present

## 2013-11-08 DIAGNOSIS — Z791 Long term (current) use of non-steroidal anti-inflammatories (NSAID): Secondary | ICD-10-CM | POA: Diagnosis not present

## 2013-11-15 ENCOUNTER — Other Ambulatory Visit: Payer: Self-pay

## 2013-11-15 DIAGNOSIS — D235 Other benign neoplasm of skin of trunk: Secondary | ICD-10-CM | POA: Diagnosis not present

## 2013-11-15 DIAGNOSIS — L821 Other seborrheic keratosis: Secondary | ICD-10-CM | POA: Diagnosis not present

## 2013-11-15 DIAGNOSIS — D485 Neoplasm of uncertain behavior of skin: Secondary | ICD-10-CM | POA: Diagnosis not present

## 2013-11-15 DIAGNOSIS — D239 Other benign neoplasm of skin, unspecified: Secondary | ICD-10-CM | POA: Diagnosis not present

## 2013-12-27 DIAGNOSIS — H8109 Meniere's disease, unspecified ear: Secondary | ICD-10-CM | POA: Diagnosis not present

## 2013-12-27 DIAGNOSIS — H905 Unspecified sensorineural hearing loss: Secondary | ICD-10-CM | POA: Diagnosis not present

## 2014-01-19 DIAGNOSIS — N200 Calculus of kidney: Secondary | ICD-10-CM | POA: Diagnosis not present

## 2014-02-10 DIAGNOSIS — Z23 Encounter for immunization: Secondary | ICD-10-CM | POA: Diagnosis not present

## 2014-09-12 DIAGNOSIS — Z8501 Personal history of malignant neoplasm of esophagus: Secondary | ICD-10-CM | POA: Diagnosis not present

## 2014-09-12 DIAGNOSIS — N410 Acute prostatitis: Secondary | ICD-10-CM | POA: Diagnosis not present

## 2014-09-12 DIAGNOSIS — K219 Gastro-esophageal reflux disease without esophagitis: Secondary | ICD-10-CM | POA: Diagnosis not present

## 2014-09-12 DIAGNOSIS — N390 Urinary tract infection, site not specified: Secondary | ICD-10-CM | POA: Diagnosis not present

## 2014-09-12 DIAGNOSIS — R358 Other polyuria: Secondary | ICD-10-CM | POA: Diagnosis not present

## 2014-09-19 DIAGNOSIS — Z791 Long term (current) use of non-steroidal anti-inflammatories (NSAID): Secondary | ICD-10-CM | POA: Diagnosis not present

## 2014-09-19 DIAGNOSIS — N390 Urinary tract infection, site not specified: Secondary | ICD-10-CM | POA: Diagnosis not present

## 2014-10-04 DIAGNOSIS — J029 Acute pharyngitis, unspecified: Secondary | ICD-10-CM | POA: Diagnosis not present

## 2014-10-05 DIAGNOSIS — J029 Acute pharyngitis, unspecified: Secondary | ICD-10-CM | POA: Diagnosis not present

## 2014-11-14 DIAGNOSIS — L821 Other seborrheic keratosis: Secondary | ICD-10-CM | POA: Diagnosis not present

## 2014-11-14 DIAGNOSIS — D692 Other nonthrombocytopenic purpura: Secondary | ICD-10-CM | POA: Diagnosis not present

## 2014-11-14 DIAGNOSIS — D3617 Benign neoplasm of peripheral nerves and autonomic nervous system of trunk, unspecified: Secondary | ICD-10-CM | POA: Diagnosis not present

## 2014-11-14 DIAGNOSIS — D2272 Melanocytic nevi of left lower limb, including hip: Secondary | ICD-10-CM | POA: Diagnosis not present

## 2014-11-14 DIAGNOSIS — D1801 Hemangioma of skin and subcutaneous tissue: Secondary | ICD-10-CM | POA: Diagnosis not present

## 2014-11-20 DIAGNOSIS — N410 Acute prostatitis: Secondary | ICD-10-CM | POA: Diagnosis not present

## 2014-12-12 DIAGNOSIS — K219 Gastro-esophageal reflux disease without esophagitis: Secondary | ICD-10-CM | POA: Diagnosis not present

## 2014-12-12 DIAGNOSIS — I10 Essential (primary) hypertension: Secondary | ICD-10-CM | POA: Diagnosis not present

## 2014-12-12 DIAGNOSIS — R739 Hyperglycemia, unspecified: Secondary | ICD-10-CM | POA: Diagnosis not present

## 2014-12-19 DIAGNOSIS — Z23 Encounter for immunization: Secondary | ICD-10-CM | POA: Diagnosis not present

## 2014-12-19 DIAGNOSIS — R238 Other skin changes: Secondary | ICD-10-CM | POA: Diagnosis not present

## 2014-12-19 DIAGNOSIS — I341 Nonrheumatic mitral (valve) prolapse: Secondary | ICD-10-CM | POA: Diagnosis not present

## 2014-12-19 DIAGNOSIS — N200 Calculus of kidney: Secondary | ICD-10-CM | POA: Diagnosis not present

## 2014-12-19 DIAGNOSIS — Z1212 Encounter for screening for malignant neoplasm of rectum: Secondary | ICD-10-CM | POA: Diagnosis not present

## 2014-12-19 DIAGNOSIS — M545 Low back pain: Secondary | ICD-10-CM | POA: Diagnosis not present

## 2014-12-19 DIAGNOSIS — I4510 Unspecified right bundle-branch block: Secondary | ICD-10-CM | POA: Diagnosis not present

## 2014-12-19 DIAGNOSIS — Z8501 Personal history of malignant neoplasm of esophagus: Secondary | ICD-10-CM | POA: Diagnosis not present

## 2015-01-03 DIAGNOSIS — H8101 Meniere's disease, right ear: Secondary | ICD-10-CM | POA: Diagnosis not present

## 2015-01-03 DIAGNOSIS — H9041 Sensorineural hearing loss, unilateral, right ear, with unrestricted hearing on the contralateral side: Secondary | ICD-10-CM | POA: Diagnosis not present

## 2015-01-22 DIAGNOSIS — N200 Calculus of kidney: Secondary | ICD-10-CM | POA: Diagnosis not present

## 2015-02-02 DIAGNOSIS — H52223 Regular astigmatism, bilateral: Secondary | ICD-10-CM | POA: Diagnosis not present

## 2015-02-02 DIAGNOSIS — H524 Presbyopia: Secondary | ICD-10-CM | POA: Diagnosis not present

## 2015-02-02 DIAGNOSIS — H2513 Age-related nuclear cataract, bilateral: Secondary | ICD-10-CM | POA: Diagnosis not present

## 2015-02-02 DIAGNOSIS — H5213 Myopia, bilateral: Secondary | ICD-10-CM | POA: Diagnosis not present

## 2015-04-19 DIAGNOSIS — J40 Bronchitis, not specified as acute or chronic: Secondary | ICD-10-CM | POA: Diagnosis not present

## 2015-04-19 DIAGNOSIS — R05 Cough: Secondary | ICD-10-CM | POA: Diagnosis not present

## 2015-04-19 DIAGNOSIS — R0602 Shortness of breath: Secondary | ICD-10-CM | POA: Diagnosis not present

## 2015-05-07 DIAGNOSIS — J40 Bronchitis, not specified as acute or chronic: Secondary | ICD-10-CM | POA: Diagnosis not present

## 2015-11-20 DIAGNOSIS — D3617 Benign neoplasm of peripheral nerves and autonomic nervous system of trunk, unspecified: Secondary | ICD-10-CM | POA: Diagnosis not present

## 2015-11-20 DIAGNOSIS — L918 Other hypertrophic disorders of the skin: Secondary | ICD-10-CM | POA: Diagnosis not present

## 2015-11-20 DIAGNOSIS — D2272 Melanocytic nevi of left lower limb, including hip: Secondary | ICD-10-CM | POA: Diagnosis not present

## 2015-11-20 DIAGNOSIS — L570 Actinic keratosis: Secondary | ICD-10-CM | POA: Diagnosis not present

## 2015-11-20 DIAGNOSIS — L814 Other melanin hyperpigmentation: Secondary | ICD-10-CM | POA: Diagnosis not present

## 2015-11-20 DIAGNOSIS — D692 Other nonthrombocytopenic purpura: Secondary | ICD-10-CM | POA: Diagnosis not present

## 2015-11-20 DIAGNOSIS — D1801 Hemangioma of skin and subcutaneous tissue: Secondary | ICD-10-CM | POA: Diagnosis not present

## 2015-11-20 DIAGNOSIS — D225 Melanocytic nevi of trunk: Secondary | ICD-10-CM | POA: Diagnosis not present

## 2015-11-20 DIAGNOSIS — D2261 Melanocytic nevi of right upper limb, including shoulder: Secondary | ICD-10-CM | POA: Diagnosis not present

## 2015-11-20 DIAGNOSIS — L821 Other seborrheic keratosis: Secondary | ICD-10-CM | POA: Diagnosis not present

## 2015-11-20 DIAGNOSIS — L820 Inflamed seborrheic keratosis: Secondary | ICD-10-CM | POA: Diagnosis not present

## 2015-11-28 DIAGNOSIS — K4090 Unilateral inguinal hernia, without obstruction or gangrene, not specified as recurrent: Secondary | ICD-10-CM | POA: Diagnosis not present

## 2015-11-30 DIAGNOSIS — K4020 Bilateral inguinal hernia, without obstruction or gangrene, not specified as recurrent: Secondary | ICD-10-CM | POA: Diagnosis not present

## 2015-12-19 DIAGNOSIS — Z0000 Encounter for general adult medical examination without abnormal findings: Secondary | ICD-10-CM | POA: Diagnosis not present

## 2015-12-19 DIAGNOSIS — I10 Essential (primary) hypertension: Secondary | ICD-10-CM | POA: Diagnosis not present

## 2015-12-19 DIAGNOSIS — K219 Gastro-esophageal reflux disease without esophagitis: Secondary | ICD-10-CM | POA: Diagnosis not present

## 2015-12-19 DIAGNOSIS — I4510 Unspecified right bundle-branch block: Secondary | ICD-10-CM | POA: Diagnosis not present

## 2015-12-26 DIAGNOSIS — M720 Palmar fascial fibromatosis [Dupuytren]: Secondary | ICD-10-CM | POA: Diagnosis not present

## 2015-12-26 DIAGNOSIS — R110 Nausea: Secondary | ICD-10-CM | POA: Diagnosis not present

## 2015-12-26 DIAGNOSIS — Z8501 Personal history of malignant neoplasm of esophagus: Secondary | ICD-10-CM | POA: Diagnosis not present

## 2015-12-26 DIAGNOSIS — R238 Other skin changes: Secondary | ICD-10-CM | POA: Diagnosis not present

## 2015-12-26 DIAGNOSIS — Z23 Encounter for immunization: Secondary | ICD-10-CM | POA: Diagnosis not present

## 2015-12-28 ENCOUNTER — Other Ambulatory Visit: Payer: Self-pay

## 2016-01-02 DIAGNOSIS — H838X1 Other specified diseases of right inner ear: Secondary | ICD-10-CM | POA: Diagnosis not present

## 2016-01-02 DIAGNOSIS — H8101 Meniere's disease, right ear: Secondary | ICD-10-CM | POA: Diagnosis not present

## 2016-01-28 DIAGNOSIS — Z87442 Personal history of urinary calculi: Secondary | ICD-10-CM | POA: Diagnosis not present

## 2016-01-31 DIAGNOSIS — K219 Gastro-esophageal reflux disease without esophagitis: Secondary | ICD-10-CM | POA: Diagnosis not present

## 2016-01-31 DIAGNOSIS — C159 Malignant neoplasm of esophagus, unspecified: Secondary | ICD-10-CM | POA: Diagnosis not present

## 2016-03-27 DIAGNOSIS — K4020 Bilateral inguinal hernia, without obstruction or gangrene, not specified as recurrent: Secondary | ICD-10-CM | POA: Diagnosis not present

## 2016-03-27 DIAGNOSIS — G8918 Other acute postprocedural pain: Secondary | ICD-10-CM | POA: Diagnosis not present

## 2016-04-23 DIAGNOSIS — Z8709 Personal history of other diseases of the respiratory system: Secondary | ICD-10-CM | POA: Diagnosis not present

## 2016-04-23 DIAGNOSIS — R05 Cough: Secondary | ICD-10-CM | POA: Diagnosis not present

## 2016-05-08 DIAGNOSIS — J4521 Mild intermittent asthma with (acute) exacerbation: Secondary | ICD-10-CM | POA: Diagnosis not present

## 2016-06-06 DIAGNOSIS — R938 Abnormal findings on diagnostic imaging of other specified body structures: Secondary | ICD-10-CM | POA: Diagnosis not present

## 2016-06-06 DIAGNOSIS — R918 Other nonspecific abnormal finding of lung field: Secondary | ICD-10-CM | POA: Diagnosis not present

## 2016-06-06 DIAGNOSIS — J4520 Mild intermittent asthma, uncomplicated: Secondary | ICD-10-CM | POA: Diagnosis not present

## 2016-07-02 DIAGNOSIS — R938 Abnormal findings on diagnostic imaging of other specified body structures: Secondary | ICD-10-CM | POA: Diagnosis not present

## 2016-07-02 DIAGNOSIS — J948 Other specified pleural conditions: Secondary | ICD-10-CM | POA: Diagnosis not present

## 2016-07-11 DIAGNOSIS — H2511 Age-related nuclear cataract, right eye: Secondary | ICD-10-CM | POA: Diagnosis not present

## 2016-07-11 DIAGNOSIS — H04123 Dry eye syndrome of bilateral lacrimal glands: Secondary | ICD-10-CM | POA: Diagnosis not present

## 2016-07-11 DIAGNOSIS — H2512 Age-related nuclear cataract, left eye: Secondary | ICD-10-CM | POA: Diagnosis not present

## 2016-07-11 DIAGNOSIS — H31092 Other chorioretinal scars, left eye: Secondary | ICD-10-CM | POA: Diagnosis not present

## 2016-07-14 ENCOUNTER — Other Ambulatory Visit: Payer: Self-pay | Admitting: Internal Medicine

## 2016-07-14 DIAGNOSIS — K449 Diaphragmatic hernia without obstruction or gangrene: Secondary | ICD-10-CM

## 2016-07-30 DIAGNOSIS — H2511 Age-related nuclear cataract, right eye: Secondary | ICD-10-CM | POA: Diagnosis not present

## 2016-07-30 DIAGNOSIS — H25811 Combined forms of age-related cataract, right eye: Secondary | ICD-10-CM | POA: Diagnosis not present

## 2016-08-07 ENCOUNTER — Ambulatory Visit
Admission: RE | Admit: 2016-08-07 | Discharge: 2016-08-07 | Disposition: A | Discharge status: Home / Self Care | Payer: PPO | Source: Ambulatory Visit | Attending: Internal Medicine | Admitting: Internal Medicine

## 2016-08-07 DIAGNOSIS — K449 Diaphragmatic hernia without obstruction or gangrene: Secondary | ICD-10-CM

## 2016-09-01 DIAGNOSIS — H2512 Age-related nuclear cataract, left eye: Secondary | ICD-10-CM | POA: Diagnosis not present

## 2016-09-03 DIAGNOSIS — H2512 Age-related nuclear cataract, left eye: Secondary | ICD-10-CM | POA: Diagnosis not present

## 2016-09-03 DIAGNOSIS — H25812 Combined forms of age-related cataract, left eye: Secondary | ICD-10-CM | POA: Diagnosis not present

## 2016-10-13 DIAGNOSIS — R5383 Other fatigue: Secondary | ICD-10-CM | POA: Diagnosis not present

## 2016-10-13 DIAGNOSIS — G44209 Tension-type headache, unspecified, not intractable: Secondary | ICD-10-CM | POA: Diagnosis not present

## 2016-10-13 DIAGNOSIS — Z8501 Personal history of malignant neoplasm of esophagus: Secondary | ICD-10-CM | POA: Diagnosis not present

## 2016-11-19 DIAGNOSIS — D2272 Melanocytic nevi of left lower limb, including hip: Secondary | ICD-10-CM | POA: Diagnosis not present

## 2016-11-19 DIAGNOSIS — L918 Other hypertrophic disorders of the skin: Secondary | ICD-10-CM | POA: Diagnosis not present

## 2016-11-19 DIAGNOSIS — D692 Other nonthrombocytopenic purpura: Secondary | ICD-10-CM | POA: Diagnosis not present

## 2016-11-19 DIAGNOSIS — L821 Other seborrheic keratosis: Secondary | ICD-10-CM | POA: Diagnosis not present

## 2016-11-19 DIAGNOSIS — D3617 Benign neoplasm of peripheral nerves and autonomic nervous system of trunk, unspecified: Secondary | ICD-10-CM | POA: Diagnosis not present

## 2016-11-19 DIAGNOSIS — D225 Melanocytic nevi of trunk: Secondary | ICD-10-CM | POA: Diagnosis not present

## 2016-11-19 DIAGNOSIS — L570 Actinic keratosis: Secondary | ICD-10-CM | POA: Diagnosis not present

## 2016-11-19 DIAGNOSIS — L814 Other melanin hyperpigmentation: Secondary | ICD-10-CM | POA: Diagnosis not present

## 2016-11-19 DIAGNOSIS — D1801 Hemangioma of skin and subcutaneous tissue: Secondary | ICD-10-CM | POA: Diagnosis not present

## 2016-11-27 ENCOUNTER — Ambulatory Visit (INDEPENDENT_AMBULATORY_CARE_PROVIDER_SITE_OTHER): Payer: PPO | Admitting: Otolaryngology

## 2016-11-27 DIAGNOSIS — R1312 Dysphagia, oropharyngeal phase: Secondary | ICD-10-CM | POA: Diagnosis not present

## 2016-11-27 DIAGNOSIS — K219 Gastro-esophageal reflux disease without esophagitis: Secondary | ICD-10-CM

## 2016-11-27 DIAGNOSIS — R490 Dysphonia: Secondary | ICD-10-CM

## 2016-12-23 DIAGNOSIS — L0889 Other specified local infections of the skin and subcutaneous tissue: Secondary | ICD-10-CM | POA: Diagnosis not present

## 2016-12-23 DIAGNOSIS — L97909 Non-pressure chronic ulcer of unspecified part of unspecified lower leg with unspecified severity: Secondary | ICD-10-CM | POA: Diagnosis not present

## 2016-12-23 DIAGNOSIS — R238 Other skin changes: Secondary | ICD-10-CM | POA: Diagnosis not present

## 2016-12-23 DIAGNOSIS — L138 Other specified bullous disorders: Secondary | ICD-10-CM | POA: Diagnosis not present

## 2016-12-23 DIAGNOSIS — R748 Abnormal levels of other serum enzymes: Secondary | ICD-10-CM | POA: Diagnosis not present

## 2016-12-23 DIAGNOSIS — L120 Bullous pemphigoid: Secondary | ICD-10-CM | POA: Diagnosis not present

## 2016-12-30 DIAGNOSIS — Z0000 Encounter for general adult medical examination without abnormal findings: Secondary | ICD-10-CM | POA: Diagnosis not present

## 2016-12-30 DIAGNOSIS — R238 Other skin changes: Secondary | ICD-10-CM | POA: Diagnosis not present

## 2016-12-31 DIAGNOSIS — H9041 Sensorineural hearing loss, unilateral, right ear, with unrestricted hearing on the contralateral side: Secondary | ICD-10-CM | POA: Diagnosis not present

## 2016-12-31 DIAGNOSIS — H8101 Meniere's disease, right ear: Secondary | ICD-10-CM | POA: Diagnosis not present

## 2016-12-31 DIAGNOSIS — K219 Gastro-esophageal reflux disease without esophagitis: Secondary | ICD-10-CM | POA: Diagnosis not present

## 2016-12-31 DIAGNOSIS — R490 Dysphonia: Secondary | ICD-10-CM | POA: Diagnosis not present

## 2017-01-01 DIAGNOSIS — Q676 Pectus excavatum: Secondary | ICD-10-CM | POA: Diagnosis not present

## 2017-01-01 DIAGNOSIS — N200 Calculus of kidney: Secondary | ICD-10-CM | POA: Diagnosis not present

## 2017-01-01 DIAGNOSIS — H9193 Unspecified hearing loss, bilateral: Secondary | ICD-10-CM | POA: Diagnosis not present

## 2017-01-01 DIAGNOSIS — K4090 Unilateral inguinal hernia, without obstruction or gangrene, not specified as recurrent: Secondary | ICD-10-CM | POA: Diagnosis not present

## 2017-01-01 DIAGNOSIS — M545 Low back pain: Secondary | ICD-10-CM | POA: Diagnosis not present

## 2017-01-01 DIAGNOSIS — Z1212 Encounter for screening for malignant neoplasm of rectum: Secondary | ICD-10-CM | POA: Diagnosis not present

## 2017-01-01 DIAGNOSIS — K5730 Diverticulosis of large intestine without perforation or abscess without bleeding: Secondary | ICD-10-CM | POA: Diagnosis not present

## 2017-01-01 DIAGNOSIS — M720 Palmar fascial fibromatosis [Dupuytren]: Secondary | ICD-10-CM | POA: Diagnosis not present

## 2017-01-01 DIAGNOSIS — Z0001 Encounter for general adult medical examination with abnormal findings: Secondary | ICD-10-CM | POA: Diagnosis not present

## 2017-01-01 DIAGNOSIS — I341 Nonrheumatic mitral (valve) prolapse: Secondary | ICD-10-CM | POA: Diagnosis not present

## 2017-01-01 DIAGNOSIS — I4510 Unspecified right bundle-branch block: Secondary | ICD-10-CM | POA: Diagnosis not present

## 2017-01-01 DIAGNOSIS — H9319 Tinnitus, unspecified ear: Secondary | ICD-10-CM | POA: Diagnosis not present

## 2017-02-02 DIAGNOSIS — L309 Dermatitis, unspecified: Secondary | ICD-10-CM | POA: Diagnosis not present

## 2017-02-02 DIAGNOSIS — L120 Bullous pemphigoid: Secondary | ICD-10-CM | POA: Diagnosis not present

## 2017-02-02 DIAGNOSIS — Z23 Encounter for immunization: Secondary | ICD-10-CM | POA: Diagnosis not present

## 2017-02-02 DIAGNOSIS — L989 Disorder of the skin and subcutaneous tissue, unspecified: Secondary | ICD-10-CM | POA: Diagnosis not present

## 2017-02-02 DIAGNOSIS — L820 Inflamed seborrheic keratosis: Secondary | ICD-10-CM | POA: Diagnosis not present

## 2017-02-10 DIAGNOSIS — K219 Gastro-esophageal reflux disease without esophagitis: Secondary | ICD-10-CM | POA: Diagnosis not present

## 2017-02-10 DIAGNOSIS — R490 Dysphonia: Secondary | ICD-10-CM | POA: Diagnosis not present

## 2017-02-17 DIAGNOSIS — N200 Calculus of kidney: Secondary | ICD-10-CM | POA: Diagnosis not present

## 2017-02-18 DIAGNOSIS — L988 Other specified disorders of the skin and subcutaneous tissue: Secondary | ICD-10-CM | POA: Diagnosis not present

## 2017-02-19 DIAGNOSIS — N200 Calculus of kidney: Secondary | ICD-10-CM | POA: Diagnosis not present

## 2017-03-16 DIAGNOSIS — N200 Calculus of kidney: Secondary | ICD-10-CM | POA: Diagnosis not present

## 2017-04-10 DIAGNOSIS — H31092 Other chorioretinal scars, left eye: Secondary | ICD-10-CM | POA: Diagnosis not present

## 2017-04-10 DIAGNOSIS — H04123 Dry eye syndrome of bilateral lacrimal glands: Secondary | ICD-10-CM | POA: Diagnosis not present

## 2017-04-10 DIAGNOSIS — Z961 Presence of intraocular lens: Secondary | ICD-10-CM | POA: Diagnosis not present

## 2017-04-10 DIAGNOSIS — H26493 Other secondary cataract, bilateral: Secondary | ICD-10-CM | POA: Diagnosis not present

## 2017-07-01 DIAGNOSIS — R140 Abdominal distension (gaseous): Secondary | ICD-10-CM | POA: Diagnosis not present

## 2017-08-12 DIAGNOSIS — R490 Dysphonia: Secondary | ICD-10-CM | POA: Diagnosis not present

## 2017-08-12 DIAGNOSIS — K219 Gastro-esophageal reflux disease without esophagitis: Secondary | ICD-10-CM | POA: Diagnosis not present

## 2017-08-12 DIAGNOSIS — H8101 Meniere's disease, right ear: Secondary | ICD-10-CM | POA: Diagnosis not present

## 2017-08-12 DIAGNOSIS — H903 Sensorineural hearing loss, bilateral: Secondary | ICD-10-CM | POA: Diagnosis not present

## 2017-08-17 DIAGNOSIS — J45909 Unspecified asthma, uncomplicated: Secondary | ICD-10-CM | POA: Diagnosis not present

## 2017-08-17 DIAGNOSIS — Z791 Long term (current) use of non-steroidal anti-inflammatories (NSAID): Secondary | ICD-10-CM | POA: Diagnosis not present

## 2017-08-17 DIAGNOSIS — R197 Diarrhea, unspecified: Secondary | ICD-10-CM | POA: Diagnosis not present

## 2017-08-17 DIAGNOSIS — N529 Male erectile dysfunction, unspecified: Secondary | ICD-10-CM | POA: Diagnosis not present

## 2017-08-17 DIAGNOSIS — K219 Gastro-esophageal reflux disease without esophagitis: Secondary | ICD-10-CM | POA: Diagnosis not present

## 2017-08-17 DIAGNOSIS — Z833 Family history of diabetes mellitus: Secondary | ICD-10-CM | POA: Diagnosis not present

## 2017-08-17 DIAGNOSIS — G8929 Other chronic pain: Secondary | ICD-10-CM | POA: Diagnosis not present

## 2017-08-17 DIAGNOSIS — Z8249 Family history of ischemic heart disease and other diseases of the circulatory system: Secondary | ICD-10-CM | POA: Diagnosis not present

## 2017-08-17 DIAGNOSIS — M62838 Other muscle spasm: Secondary | ICD-10-CM | POA: Diagnosis not present

## 2017-08-17 DIAGNOSIS — M5136 Other intervertebral disc degeneration, lumbar region: Secondary | ICD-10-CM | POA: Diagnosis not present

## 2017-09-08 DIAGNOSIS — N200 Calculus of kidney: Secondary | ICD-10-CM | POA: Diagnosis not present

## 2017-09-29 ENCOUNTER — Other Ambulatory Visit (HOSPITAL_COMMUNITY): Payer: Self-pay | Admitting: Urology

## 2017-09-29 ENCOUNTER — Other Ambulatory Visit: Payer: Self-pay | Admitting: Urology

## 2017-09-29 DIAGNOSIS — N200 Calculus of kidney: Secondary | ICD-10-CM

## 2017-10-13 DIAGNOSIS — D485 Neoplasm of uncertain behavior of skin: Secondary | ICD-10-CM | POA: Diagnosis not present

## 2017-10-13 DIAGNOSIS — H61001 Unspecified perichondritis of right external ear: Secondary | ICD-10-CM | POA: Diagnosis not present

## 2017-10-13 DIAGNOSIS — L820 Inflamed seborrheic keratosis: Secondary | ICD-10-CM | POA: Diagnosis not present

## 2017-10-14 ENCOUNTER — Ambulatory Visit (HOSPITAL_COMMUNITY): Payer: PPO

## 2017-11-06 NOTE — Patient Instructions (Addendum)
Peter Richmond  11/06/2017   Your procedure is scheduled on: 11-13-17  Report to [facility] Main  Entrance  Report to [facility] AM then go to Short Stay where the nurses will get you ready for your procedure.     Call this number if you have problems the morning of surgery [phone]   Remember: Do not eat food or drink liquids :After Midnight.     Take these medicines the morning of surgery with A SIP OF WATER: OMEPRAZOLE (PRILOSEC)             You may not have any metal on your body including hair pins and              piercings  Do not wear jewelry, , lotions, powders or perfumes, deodorant               Men may shave face and neck.   Do not bring valuables to the hospital. [place].  Contacts, dentures or bridgework may not be worn into surgery.  Leave suitcase in the car. After surgery it may be brought to your room.                  Please read over the following fact sheets you were given: _____________________________________________________________________             [facility] - Preparing for Surgery Before surgery, you can play an important role.  Because skin is not sterile, your skin needs to be as free of germs as possible.  You can reduce the number of germs on your skin by washing with CHG (chlorahexidine gluconate) soap before surgery.  CHG is an antiseptic cleaner which kills germs and bonds with the skin to continue killing germs even after washing. Please DO NOT use if you have an allergy to CHG or antibacterial soaps.  If your skin becomes reddened/irritated stop using the CHG and inform your nurse when you arrive at Short Stay. Do not shave (including legs and underarms) for at least 48 hours prior to the first CHG shower.  You may shave your face/neck. Please follow these instructions carefully:  1.  Shower with CHG Soap the night before surgery and the  morning  of Surgery.  2.  If you choose to wash your hair, wash your hair first as usual with your  normal  shampoo.  3.  After you shampoo, rinse your hair and body thoroughly to remove the  shampoo.                           4.  Use CHG as you would any other liquid soap.  You can apply chg directly  to the skin and wash                       Gently with a scrungie or clean washcloth.  5.  Apply the CHG Soap to your body ONLY FROM THE NECK DOWN.   Do not use on face/ open                           Wound or open sores. Avoid contact with eyes, ears  mouth and genitals (private parts).                       Wash face,  Genitals (private parts) with your normal soap.             6.  Wash thoroughly, paying special attention to the area where your surgery  will be performed.  7.  Thoroughly rinse your body with warm water from the neck down.  8.  DO NOT shower/wash with your normal soap after using and rinsing off  the CHG Soap.                9.  Pat yourself dry with a clean towel.            10.  Wear clean pajamas.            11.  Place clean sheets on your bed the night of your first shower and do not  sleep with pets. Day of Surgery : Do not apply any lotions/deodorants the morning of surgery.  Please wear clean clothes to the hospital/surgery center.  FAILURE TO FOLLOW THESE INSTRUCTIONS MAY RESULT IN THE CANCELLATION OF YOUR SURGERY PATIENT SIGNATURE_________________________________  NURSE SIGNATURE__________________________________  ________________________________________________________________________

## 2017-11-09 ENCOUNTER — Encounter (HOSPITAL_COMMUNITY)
Admission: RE | Admit: 2017-11-09 | Discharge: 2017-11-09 | Disposition: A | Discharge status: Home / Self Care | Payer: Medicare HMO | Source: Ambulatory Visit | Attending: Urology | Admitting: Urology

## 2017-11-09 ENCOUNTER — Encounter (HOSPITAL_COMMUNITY): Payer: Self-pay

## 2017-11-09 ENCOUNTER — Other Ambulatory Visit: Payer: Self-pay

## 2017-11-09 DIAGNOSIS — N200 Calculus of kidney: Secondary | ICD-10-CM | POA: Diagnosis not present

## 2017-11-09 DIAGNOSIS — Z01812 Encounter for preprocedural laboratory examination: Secondary | ICD-10-CM | POA: Diagnosis not present

## 2017-11-09 HISTORY — DX: Allergic contact dermatitis due to plants, except food: L23.7

## 2017-11-09 HISTORY — DX: Personal history of urinary calculi: Z87.442

## 2017-11-09 LAB — CBC
HCT: 43.7 % (ref 39.0–52.0)
Hemoglobin: 14.4 g/dL (ref 13.0–17.0)
MCH: 29.9 pg (ref 26.0–34.0)
MCHC: 33 g/dL (ref 30.0–36.0)
MCV: 90.9 fL (ref 78.0–100.0)
Platelets: 191 10*3/uL (ref 150–400)
RBC: 4.81 MIL/uL (ref 4.22–5.81)
RDW: 13.5 % (ref 11.5–15.5)
WBC: 6.7 10*3/uL (ref 4.0–10.5)

## 2017-11-09 LAB — BASIC METABOLIC PANEL
ANION GAP: 7 (ref 5–15)
BUN: 20 mg/dL (ref 8–23)
CHLORIDE: 109 mmol/L (ref 98–111)
CO2: 25 mmol/L (ref 22–32)
Calcium: 9.3 mg/dL (ref 8.9–10.3)
Creatinine, Ser: 1.02 mg/dL (ref 0.61–1.24)
GFR calc Af Amer: >60 mL/min (ref >60)
GFR calc non Af Amer: >60 mL/min (ref >60)
GLUCOSE: 96 mg/dL (ref 70–99)
POTASSIUM: 4.2 mmol/L (ref 3.5–5.1)
Sodium: 141 mmol/L (ref 135–145)

## 2017-11-12 ENCOUNTER — Other Ambulatory Visit: Payer: Self-pay | Admitting: Radiology

## 2017-11-13 ENCOUNTER — Ambulatory Visit (HOSPITAL_COMMUNITY): Payer: Medicare HMO | Admitting: Anesthesiology

## 2017-11-13 ENCOUNTER — Ambulatory Visit (HOSPITAL_COMMUNITY)
Admission: RE | Admit: 2017-11-13 | Discharge: 2017-11-13 | Disposition: A | Discharge status: Home / Self Care | Payer: Medicare HMO | Source: Ambulatory Visit | Attending: Urology | Admitting: Urology

## 2017-11-13 ENCOUNTER — Encounter (HOSPITAL_COMMUNITY)
Admission: RE | Disposition: A | Discharge status: Home / Self Care | Payer: Self-pay | Source: Ambulatory Visit | Attending: Urology

## 2017-11-13 ENCOUNTER — Encounter (HOSPITAL_COMMUNITY): Payer: Self-pay | Admitting: *Deleted

## 2017-11-13 ENCOUNTER — Ambulatory Visit (HOSPITAL_COMMUNITY): Payer: Medicare HMO

## 2017-11-13 ENCOUNTER — Other Ambulatory Visit: Payer: Self-pay

## 2017-11-13 ENCOUNTER — Ambulatory Visit (HOSPITAL_COMMUNITY)
Admission: RE | Admit: 2017-11-13 | Discharge: 2017-11-14 | Disposition: A | Discharge status: Home / Self Care | Payer: Medicare HMO | Source: Ambulatory Visit | Attending: Urology | Admitting: Urology

## 2017-11-13 DIAGNOSIS — Z8679 Personal history of other diseases of the circulatory system: Secondary | ICD-10-CM | POA: Diagnosis not present

## 2017-11-13 DIAGNOSIS — Z8501 Personal history of malignant neoplasm of esophagus: Secondary | ICD-10-CM | POA: Diagnosis not present

## 2017-11-13 DIAGNOSIS — Z87442 Personal history of urinary calculi: Secondary | ICD-10-CM | POA: Insufficient documentation

## 2017-11-13 DIAGNOSIS — Z79899 Other long term (current) drug therapy: Secondary | ICD-10-CM | POA: Diagnosis not present

## 2017-11-13 DIAGNOSIS — N200 Calculus of kidney: Secondary | ICD-10-CM

## 2017-11-13 DIAGNOSIS — K219 Gastro-esophageal reflux disease without esophagitis: Secondary | ICD-10-CM | POA: Insufficient documentation

## 2017-11-13 DIAGNOSIS — Z87891 Personal history of nicotine dependence: Secondary | ICD-10-CM | POA: Insufficient documentation

## 2017-11-13 HISTORY — PX: IR URETERAL STENT RIGHT NEW ACCESS W/O SEP NEPHROSTOMY CATH: IMG6076

## 2017-11-13 HISTORY — PX: NEPHROLITHOTOMY: SHX5134

## 2017-11-13 LAB — PROTIME-INR
INR: 0.87
Prothrombin Time: 11.7 seconds (ref 11.4–15.2)

## 2017-11-13 LAB — CBC
HEMATOCRIT: 46.3 % (ref 39.0–52.0)
Hemoglobin: 15.2 g/dL (ref 13.0–17.0)
MCH: 29.8 pg (ref 26.0–34.0)
MCHC: 32.8 g/dL (ref 30.0–36.0)
MCV: 90.8 fL (ref 78.0–100.0)
PLATELETS: 204 10*3/uL (ref 150–400)
RBC: 5.1 MIL/uL (ref 4.22–5.81)
RDW: 13.6 % (ref 11.5–15.5)
WBC: 7.4 10*3/uL (ref 4.0–10.5)

## 2017-11-13 LAB — APTT: aPTT: 25 seconds (ref 24–36)

## 2017-11-13 SURGERY — NEPHROLITHOTOMY PERCUTANEOUS
Anesthesia: General | Laterality: Right

## 2017-11-13 MED ORDER — LIDOCAINE HCL (PF) 1 % IJ SOLN
INTRAMUSCULAR | Status: AC | PRN
  Administered 2017-11-13: 10 mL

## 2017-11-13 MED ORDER — IOPAMIDOL (ISOVUE-300) INJECTION 61%
INTRAVENOUS | Status: AC
  Administered 2017-11-13: 35 mL
  Filled 2017-11-13: qty 50

## 2017-11-13 MED ORDER — TAMSULOSIN HCL 0.4 MG PO CAPS: 0.4000 mg | ORAL_CAPSULE | ORAL | 0 refills | Status: DC

## 2017-11-13 MED ORDER — CEFAZOLIN SODIUM-DEXTROSE 2-4 GM/100ML-% IV SOLN: 2.0000 g | Freq: Once | INTRAVENOUS | Status: DC

## 2017-11-13 MED ORDER — ONDANSETRON HCL 4 MG/2ML IJ SOLN: 4.0000 mg | Freq: Once | INTRAMUSCULAR | Status: DC | PRN

## 2017-11-13 MED ORDER — DEXAMETHASONE SODIUM PHOSPHATE 10 MG/ML IJ SOLN
INTRAMUSCULAR | Status: AC
  Filled 2017-11-13: qty 1

## 2017-11-13 MED ORDER — HEMOSTATIC AGENTS (NO CHARGE) OPTIME
TOPICAL | Status: DC | PRN
  Administered 2017-11-13: 1 via TOPICAL

## 2017-11-13 MED ORDER — FENTANYL CITRATE (PF) 100 MCG/2ML IJ SOLN: 25.0000 ug | INTRAMUSCULAR | Status: DC | PRN

## 2017-11-13 MED ORDER — CIPROFLOXACIN IN D5W 200 MG/100ML IV SOLN
200.0000 mg | Freq: Two times a day (BID) | INTRAVENOUS | Status: AC
  Administered 2017-11-13: 200 mg via INTRAVENOUS
  Filled 2017-11-13: qty 100

## 2017-11-13 MED ORDER — ROCURONIUM BROMIDE 10 MG/ML (PF) SYRINGE
PREFILLED_SYRINGE | INTRAVENOUS | Status: AC
  Filled 2017-11-13: qty 10

## 2017-11-13 MED ORDER — PROPOFOL 10 MG/ML IV BOLUS
INTRAVENOUS | Status: DC | PRN
  Administered 2017-11-13: 100 mg via INTRAVENOUS

## 2017-11-13 MED ORDER — FENTANYL CITRATE (PF) 100 MCG/2ML IJ SOLN
INTRAMUSCULAR | Status: AC
  Filled 2017-11-13: qty 2

## 2017-11-13 MED ORDER — MIDAZOLAM HCL 2 MG/2ML IJ SOLN
INTRAMUSCULAR | Status: AC
  Filled 2017-11-13: qty 2

## 2017-11-13 MED ORDER — ONDANSETRON HCL 4 MG/2ML IJ SOLN
INTRAMUSCULAR | Status: AC | PRN
  Administered 2017-11-13: 4 mg via INTRAVENOUS

## 2017-11-13 MED ORDER — PROPOFOL 10 MG/ML IV BOLUS
INTRAVENOUS | Status: AC
  Filled 2017-11-13: qty 20

## 2017-11-13 MED ORDER — ONDANSETRON HCL 4 MG/2ML IJ SOLN
INTRAMUSCULAR | Status: AC
  Filled 2017-11-13: qty 2

## 2017-11-13 MED ORDER — SUGAMMADEX SODIUM 200 MG/2ML IV SOLN
INTRAVENOUS | Status: AC
  Filled 2017-11-13: qty 2

## 2017-11-13 MED ORDER — MIDAZOLAM HCL 2 MG/2ML IJ SOLN
INTRAMUSCULAR | Status: AC | PRN
  Administered 2017-11-13 (×5): 1 mg via INTRAVENOUS

## 2017-11-13 MED ORDER — LIDOCAINE HCL 1 % IJ SOLN
INTRAMUSCULAR | Status: AC
  Filled 2017-11-13: qty 20

## 2017-11-13 MED ORDER — ACETAMINOPHEN 325 MG PO TABS: 650.0000 mg | ORAL_TABLET | ORAL | Status: DC | PRN

## 2017-11-13 MED ORDER — HYDROMORPHONE HCL 1 MG/ML IJ SOLN: 0.5000 mg | INTRAMUSCULAR | Status: DC | PRN

## 2017-11-13 MED ORDER — IOPAMIDOL (ISOVUE-300) INJECTION 61%
50.0000 mL | Freq: Once | INTRAVENOUS | Status: AC | PRN
  Administered 2017-11-13: 45 mL

## 2017-11-13 MED ORDER — BELLADONNA ALKALOIDS-OPIUM 16.2-60 MG RE SUPP
1.0000 | Freq: Four times a day (QID) | RECTAL | Status: DC | PRN
  Administered 2017-11-13: 1 via RECTAL

## 2017-11-13 MED ORDER — ROCURONIUM BROMIDE 100 MG/10ML IV SOLN
INTRAVENOUS | Status: DC | PRN
  Administered 2017-11-13: 50 mg via INTRAVENOUS

## 2017-11-13 MED ORDER — DEXAMETHASONE SODIUM PHOSPHATE 10 MG/ML IJ SOLN
INTRAMUSCULAR | Status: DC | PRN
  Administered 2017-11-13: 10 mg via INTRAVENOUS

## 2017-11-13 MED ORDER — IOPAMIDOL (ISOVUE-300) INJECTION 61%
INTRAVENOUS | Status: AC
  Administered 2017-11-13: 45 mL
  Filled 2017-11-13: qty 50

## 2017-11-13 MED ORDER — SODIUM CHLORIDE 0.9 % IR SOLN
Status: DC | PRN
  Administered 2017-11-13: 12000 mL

## 2017-11-13 MED ORDER — ONDANSETRON HCL 4 MG/2ML IJ SOLN: 4.0000 mg | INTRAMUSCULAR | Status: DC | PRN

## 2017-11-13 MED ORDER — FENTANYL CITRATE (PF) 100 MCG/2ML IJ SOLN
INTRAMUSCULAR | Status: AC | PRN
  Administered 2017-11-13 (×4): 50 ug via INTRAVENOUS

## 2017-11-13 MED ORDER — OXYCODONE-ACETAMINOPHEN 5-325 MG PO TABS: 1.0000 | ORAL_TABLET | ORAL | Status: DC | PRN

## 2017-11-13 MED ORDER — IOPAMIDOL (ISOVUE-300) INJECTION 61%
50.0000 mL | Freq: Once | INTRAVENOUS | Status: AC | PRN
Start: 2017-11-13 — End: 2017-11-13
  Administered 2017-11-13: 35 mL

## 2017-11-13 MED ORDER — FENTANYL CITRATE (PF) 100 MCG/2ML IJ SOLN
INTRAMUSCULAR | Status: DC | PRN
  Administered 2017-11-13: 50 ug via INTRAVENOUS

## 2017-11-13 MED ORDER — LACTATED RINGERS IV SOLN
INTRAVENOUS | Status: DC
  Administered 2017-11-13 (×2): via INTRAVENOUS

## 2017-11-13 MED ORDER — LIDOCAINE 2% (20 MG/ML) 5 ML SYRINGE
INTRAMUSCULAR | Status: AC
  Filled 2017-11-13: qty 5

## 2017-11-13 MED ORDER — LIDOCAINE HCL (CARDIAC) PF 100 MG/5ML IV SOSY
PREFILLED_SYRINGE | INTRAVENOUS | Status: DC | PRN
  Administered 2017-11-13: 50 mg via INTRAVENOUS

## 2017-11-13 MED ORDER — ONDANSETRON HCL 4 MG/2ML IJ SOLN
INTRAMUSCULAR | Status: DC | PRN
  Administered 2017-11-13: 4 mg via INTRAVENOUS

## 2017-11-13 MED ORDER — SODIUM CHLORIDE 0.9 % IV SOLN: INTRAVENOUS | Status: DC

## 2017-11-13 MED ORDER — FENTANYL CITRATE (PF) 250 MCG/5ML IJ SOLN
INTRAMUSCULAR | Status: AC
  Filled 2017-11-13: qty 5

## 2017-11-13 MED ORDER — CIPROFLOXACIN IN D5W 400 MG/200ML IV SOLN: 400.0000 mg | Freq: Once | INTRAVENOUS | Status: DC

## 2017-11-13 MED ORDER — CIPROFLOXACIN IN D5W 400 MG/200ML IV SOLN
INTRAVENOUS | Status: AC
  Administered 2017-11-13: 400 mg
  Filled 2017-11-13: qty 200

## 2017-11-13 MED ORDER — SUGAMMADEX SODIUM 200 MG/2ML IV SOLN
INTRAVENOUS | Status: DC | PRN
  Administered 2017-11-13: 200 mg via INTRAVENOUS

## 2017-11-13 MED ORDER — BELLADONNA ALKALOIDS-OPIUM 16.2-60 MG RE SUPP
RECTAL | Status: AC
  Administered 2017-11-13: 1 via RECTAL
  Filled 2017-11-13: qty 1

## 2017-11-13 MED ORDER — SODIUM CHLORIDE 0.9 % IV SOLN
INTRAVENOUS | Status: DC
  Administered 2017-11-13 – 2017-11-14 (×2): via INTRAVENOUS

## 2017-11-13 MED ORDER — HYDROCODONE-ACETAMINOPHEN 10-325 MG PO TABS: 1.0000 | ORAL_TABLET | ORAL | 0 refills | Status: DC | PRN

## 2017-11-13 SURGICAL SUPPLY — 48 items
APPLICATOR SURGIFLO ENDO (HEMOSTASIS) ×2 IMPLANT
BAG URINE DRAINAGE (UROLOGICAL SUPPLIES) IMPLANT
BASKET ZERO TIP NITINOL 2.4FR (BASKET) IMPLANT
BENZOIN TINCTURE PRP APPL 2/3 (GAUZE/BANDAGES/DRESSINGS) ×2 IMPLANT
BLADE SURG 15 STRL LF DISP TIS (BLADE) ×1 IMPLANT
BLADE SURG 15 STRL SS (BLADE) ×1
CATH FOLEY 2W COUNCIL 20FR 5CC (CATHETERS) IMPLANT
CATH FOLEY 2WAY SLVR  5CC 18FR (CATHETERS)
CATH FOLEY 2WAY SLVR 5CC 18FR (CATHETERS) IMPLANT
CATH IMAGER II 65CM (CATHETERS) IMPLANT
CATH X-FORCE N30 NEPHROSTOMY (TUBING) ×2 IMPLANT
CHLORAPREP W/TINT 26ML (MISCELLANEOUS) ×2 IMPLANT
COVER SURGICAL LIGHT HANDLE (MISCELLANEOUS) ×2 IMPLANT
DRAPE C-ARM 42X120 X-RAY (DRAPES) ×2 IMPLANT
DRAPE LINGEMAN PERC (DRAPES) ×2 IMPLANT
DRAPE SURG IRRIG POUCH 19X23 (DRAPES) ×2 IMPLANT
DRSG PAD ABDOMINAL 8X10 ST (GAUZE/BANDAGES/DRESSINGS) IMPLANT
DRSG TEGADERM 4X4.75 (GAUZE/BANDAGES/DRESSINGS) ×2 IMPLANT
DRSG TEGADERM 8X12 (GAUZE/BANDAGES/DRESSINGS) IMPLANT
FIBER LASER TRAC TIP (UROLOGICAL SUPPLIES) IMPLANT
FLOSEAL 5ML (HEMOSTASIS) ×4 IMPLANT
GAUZE 4X4 16PLY RFD (DISPOSABLE) ×2 IMPLANT
GAUZE SPONGE 4X4 12PLY STRL (GAUZE/BANDAGES/DRESSINGS) IMPLANT
GLOVE BIOGEL M 8.0 STRL (GLOVE) ×2 IMPLANT
GOWN STRL REUS W/TWL XL LVL3 (GOWN DISPOSABLE) ×2 IMPLANT
GUIDEWIRE AMPLAZ .035X145 (WIRE) ×4 IMPLANT
GUIDEWIRE STR DUAL SENSOR (WIRE) ×2 IMPLANT
HOLDER FOLEY CATH W/STRAP (MISCELLANEOUS) ×2 IMPLANT
KIT BASIN OR (CUSTOM PROCEDURE TRAY) ×2 IMPLANT
MANIFOLD NEPTUNE II (INSTRUMENTS) ×2 IMPLANT
NS IRRIG 1000ML POUR BTL (IV SOLUTION) ×2 IMPLANT
PACK CYSTO (CUSTOM PROCEDURE TRAY) ×2 IMPLANT
PROBE LITHOCLAST ULTRA 3.8X403 (UROLOGICAL SUPPLIES) IMPLANT
PROBE PNEUMATIC 1.0MMX570MM (UROLOGICAL SUPPLIES) IMPLANT
SHEATH PEELAWAY SET 9 (SHEATH) ×2 IMPLANT
SPONGE LAP 4X18 RFD (DISPOSABLE) ×2 IMPLANT
STENT URET 6FRX24 CONTOUR (STENTS) ×2 IMPLANT
STONE CATCHER W/TUBE ADAPTER (UROLOGICAL SUPPLIES) ×2 IMPLANT
SURGIFLO W/THROMBIN 8M KIT (HEMOSTASIS) IMPLANT
SUT MNCRL AB 4-0 PS2 18 (SUTURE) ×2 IMPLANT
SUT SILK 2 0 30  PSL (SUTURE)
SUT SILK 2 0 30 PSL (SUTURE) IMPLANT
SYR 10ML LL (SYRINGE) ×2 IMPLANT
SYR 20CC LL (SYRINGE) ×4 IMPLANT
TOWEL OR 17X26 10 PK STRL BLUE (TOWEL DISPOSABLE) ×2 IMPLANT
TOWEL OR NON WOVEN STRL DISP B (DISPOSABLE) ×2 IMPLANT
TRAY FOLEY MTR SLVR 16FR STAT (SET/KITS/TRAYS/PACK) ×2 IMPLANT
TUBING CONNECTING 10 (TUBING) ×4 IMPLANT

## 2017-11-13 NOTE — Op Note (Signed)
PATIENT:  Peter Richmond  PRE-OPERATIVE DIAGNOSIS: Right renal calculus  POST-OPERATIVE DIAGNOSIS: Same  PROCEDURE: 1. Percutaneous nephrostomy sheath placement. 2.  Right percutaneous nephrolithotomy (1.7 cm.) 3. Antegrade double-J stent placement.  SURGEON:  Claybon Jabs  INDICATION: Peter Richmond is a 84 year old male with a history of calculus disease.  He has undergone lithotripsy in the past for stones but has very dense stones with Hounsfield units of 1300.  He was found to have some increase in size of a right renal calculus but was not having any flank pain so he underwent a 24-hour urine to evaluate for the cause of stone formation and despite therapy he developed increasing size of his stone.  We discussed the options for management and he is elected to proceed with percutaneous nephrolithotomy due to the size of his stone and its known density.  ANESTHESIA:  General  EBL:  Minimal  DRAINS: 6 French, 24 cm double-J stent in the right ureter.  LOCAL MEDICATIONS USED:  None  SPECIMEN: Stone given to patient  Description of procedure: After informed consent the patient was taken to the operating room and administered general endotracheal anesthesia. Once fully anesthetized the patient had an 85 French Foley catheter placed It was then moved from the stretcher onto the operating room table in a prone position with bony prominences padded and chest pads in place. The flank with exiting nephrostomy catheter was then sterilely prepped and draped in standard fashion. An official timeout was then performed.  Using the existing nephrostomy catheter access I passed a 0.038 inch floppy tipped guidewire down the ureter into the bladder under fluoroscopy.  This was left in place and the nephrostomy catheter was removed.  A transverse incision was made over the guidewire and a peel-away coaxial catheter was then passed over the guidewire and down the ureter under fluoroscopy.  The inner  portion of the coaxial system was then removed and a second guidewire was passed through this catheter and down the ureter into the bladder under fluoroscopy.  The coaxial catheter was then removed and one of the guidewires was secured to the drape as a safety guidewire and the second guidewire was used as a working guidewire.  The NephroMax nephrostomy dilating balloon was then passed over the working guidewire into the area of the renal pelvis under fluoroscopy.  It was then inflated using dilute contrast under fluoroscopy until the balloon was fully inflated.  I then passed the 28 French nephrostomy access sheath over the balloon into the area of the renal pelvis under fluoroscopy and then deflated the balloon and removed the dilating balloon.  The 53 French rigid nephroscope was then passed under direct vision through the nephrostomy access sheath.  Clotted blood was evacuated and the stone was identified.  I used the [employer] and began fragmenting the stone.  I then grasped the fragments with 2 prong graspers and was able to remove all of the visible stone.  I then switched to the flexible cystoscope and directed this into the lower pole calyx where the stone had been located.  No further stone fragments were noted in this location.  There appeared to be a second calyx off of the infundibulum to the lower pole calyx but I could not access this location.  He did have a small stone located very peripherally on his KUB and I think that may have been where the stone was but it could not be accessed with the flexible scope.  I switched back to the rigid scope and backloaded the scope over the working guidewire and passed the double-J stent over the guidewire and down into the bladder.  As I began to remove the guidewire good curl was noted in the bladder.  Good curl was also noted in the renal pelvis when I remove the guidewire completely and then I grasped the stent with 2 prong graspers  while removing the safety guidewire.  Fluoroscopy confirmed the stent to be in good position proximally and distally.  There is located in the renal pelvis visually.  There was minimal bleeding and therefore I elected to proceed with a "tubeless" procedure.  I then measured the distance to the renal parenchyma and marked this on the laparoscopic introducer which I used to inject 10 cc of FloSeal beginning at the level of the renal parenchyma and slowly withdrawing this through the nephrostomy tract.  I closed the skin with running 4-0 subcuticular Monocryl and a sterile gauze dressing and Tegaderm were applied.  The patient was awakened and taken to the recovery room in stable and satisfactory condition.  He tolerated procedure well no intraoperative complications.    PLAN OF CARE: Discharge to home after an overnight stay.  PATIENT DISPOSITION:  PACU - hemodynamically stable.

## 2017-11-13 NOTE — Anesthesia Preprocedure Evaluation (Addendum)
Anesthesia Evaluation  Patient identified by MRN, date of birth, ID band Patient awake    Reviewed: Allergy & Precautions, NPO status , Patient's Chart, lab work & pertinent test results  Airway Mallampati: II  TM Distance: >3 FB Neck ROM: Full    Dental  (+) Teeth Intact, Dental Advisory Given   Pulmonary former smoker,    breath sounds clear to auscultation       Cardiovascular  Rhythm:Regular Rate:Normal     Neuro/Psych    GI/Hepatic   Endo/Other    Renal/GU      Musculoskeletal   Abdominal   Peds  Hematology   Anesthesia Other Findings   Reproductive/Obstetrics                             Anesthesia Physical Anesthesia Plan  ASA: III  Anesthesia Plan: General   Post-op Pain Management:    Induction: Intravenous  PONV Risk Score and Plan: Ondansetron and Dexamethasone  Airway Management Planned: Oral ETT  Additional Equipment:   Intra-op Plan:   Post-operative Plan: Extubation in OR  Informed Consent: I have reviewed the patients History and Physical, chart, labs and discussed the procedure including the risks, benefits and alternatives for the proposed anesthesia with the patient or authorized representative who has indicated his/her understanding and acceptance.   Dental advisory given  Plan Discussed with: CRNA and Anesthesiologist  Anesthesia Plan Comments:         Anesthesia Quick Evaluation  

## 2017-11-13 NOTE — Procedures (Signed)
Interventional Radiology Procedure Note  Procedure: Image guided right PCN/nephroureteral catheter for operative access.    Findings:  Interpolar posterior calyx/infundibulum selected.  25F cateter to the urinary bladder sutured in place with silk.    The catheter will accept an 035 wire. .  Complications: None  Recommendations:  - To the OR with Dr. Karsten Ro    Signed,  Dulcy Fanny. Earleen Newport, DO

## 2017-11-13 NOTE — Anesthesia Procedure Notes (Signed)
Procedure Name: Intubation Date/Time: 11/13/2017 11:44 AM Performed by: Glory Buff, CRNA Pre-anesthesia Checklist: Patient identified, Emergency Drugs available, Suction available and Patient being monitored Patient Re-evaluated:Patient Re-evaluated prior to induction Oxygen Delivery Method: Circle system utilized Preoxygenation: Pre-oxygenation with 100% oxygen Induction Type: IV induction Ventilation: Mask ventilation without difficulty Laryngoscope Size: Miller and 3 Grade View: Grade I Tube type: Oral Tube size: 7.5 mm Number of attempts: 1 Airway Equipment and Method: Stylet and Oral airway Placement Confirmation: ETT inserted through vocal cords under direct vision,  positive ETCO2 and breath sounds checked- equal and bilateral Secured at: 22 cm Tube secured with: Tape Dental Injury: Teeth and Oropharynx as per pre-operative assessment

## 2017-11-13 NOTE — H&P (Addendum)
Chief Complaint: Patient was seen in consultation today for right renal calculi  Referring Physician(s): Kathie Rhodes  Supervising Physician: Corrie Mckusick  Patient Status: [facility] - Out-pt  History of Present Illness: Peter Richmond is a 84 y.o. male with past medical history of esophageal cancer s/p treatment, and kidney stones who presented to his Urologist with large right kidney stone which will require extraction. Patient has plans for surgical intervention today.  IR consulted for right nephrostomy/nephroureteral stent placement prior to OR.   Patient presents to radiology today in his usual state of health.  He has been NPO.  He does not take blood thinners.   Past Medical History:  Diagnosis Date  . Calculus of right kidney   . Cancer ([place])    hx of esophageal cancer- 2006   . GERD (gastroesophageal reflux disease)   . History of esophageal cancer    S/P ESOPHAGECTOMY 2006 (STAGE I)  NO CHEMORADIATION--  NO RECURRENCE  . History of kidney stones   . Poison oak    recent exposure at time of preop on 11/09/17 - on right arm and on back of legs per patient- almost healed     Past Surgical History:  Procedure Laterality Date  . cataract surgery      bilateral   . CYSTOSCOPY W/ URETERAL STENT PLACEMENT Right 01/10/2013   Procedure: CYSTOSCOPY WITH RETROGRADE PYELOGRAM/URETERAL STENT PLACEMENT;  Surgeon: Claybon Jabs, MD;  Location: [facility];  Service: Urology;  Laterality: Right;  . LAPAROSCOPIC CHOLECYSTECTOMY  06-24-2005  . TONSILLECTOMY  1943  . TRANSHIATAL ESOPHAGECTOMY/ JEJUNOSTOMY/ PYLOROPLASTY  03-31-2005  DR Arlyce Dice   ESOPHAGEAL CANCER--  NO RECURRENCE    Allergies: Prevacid [lansoprazole]  Medications: Prior to Admission medications   Medication Sig Start Date End Date Taking? Authorizing Provider  cholecalciferol (VITAMIN D) 1000 units tablet Take 1,000 tablets by mouth daily.     [provider]  Omega-3 Fatty Acids (FISH  OIL) 1200 MG CAPS Take 1,200 mg by mouth daily.     [provider]  omeprazole (PRILOSEC) 20 MG capsule Take 20 mg by mouth every evening.     [provider]     No family history on file.  Social History   Socioeconomic History  . Marital status: Widowed    Spouse name: Not on file  . Number of children: Not on file  . Years of education: Not on file  . Highest education level: Not on file  Occupational History  . Not on file  Social Needs  . Financial resource strain: Not on file  . Food insecurity:    Worry: Not on file    Inability: Not on file  . Transportation needs:    Medical: Not on file    Non-medical: Not on file  Tobacco Use  . Smoking status: Former Smoker    Years: 20.00    Types: Cigarettes    Last attempt to quit: 01/01/1984    Years since quitting: 33.8  . Smokeless tobacco: Never Used  . Tobacco comment: OCCASIONAL SMOKER ON/ OFF   Substance and Sexual Activity  . Alcohol use: No  . Drug use: No  . Sexual activity: Not on file  Lifestyle  . Physical activity:    Days per week: Not on file    Minutes per session: Not on file  . Stress: Not on file  Relationships  . Social connections:    Talks on phone: Not on file  Gets together: Not on file    Attends religious service: Not on file    Active member of club or organization: Not on file    Attends meetings of clubs or organizations: Not on file    Relationship status: Not on file  Other Topics Concern  . Not on file  Social History Narrative  . Not on file     Review of Systems: A 12 point ROS discussed and pertinent positives are indicated in the HPI above.  All other systems are negative.  Review of Systems  Constitutional: Negative for fatigue and fever.  Respiratory: Negative for cough and shortness of breath.   Cardiovascular: Negative for chest pain.  Gastrointestinal: Negative for abdominal pain.  Genitourinary: Negative for dysuria and hematuria.    Musculoskeletal: Negative for back pain.  Psychiatric/Behavioral: Negative for behavioral problems and confusion.    Vital Signs: There were no vitals taken for this visit.  Physical Exam  Constitutional: He is oriented to person, place, and time. He appears well-developed. No distress.  Neck: Normal range of motion. Neck supple. No tracheal deviation present.  Cardiovascular: Normal rate, regular rhythm and normal heart sounds.  Pulmonary/Chest: Effort normal and breath sounds normal. No respiratory distress.  Neurological: He is alert and oriented to person, place, and time.  Skin: Skin is warm and dry. He is not diaphoretic.  Psychiatric: He has a normal mood and affect. His behavior is normal. Judgment and thought content normal.  Nursing note and vitals reviewed.    MD Evaluation Airway: WNL Heart: WNL Abdomen: WNL Chest/ Lungs: WNL ASA  Classification: 2 Mallampati/Airway Score: One   Imaging: No results found.  Labs:  CBC: Recent Labs    11/09/17 1117 11/13/17 0757  WBC 6.7 7.4  HGB 14.4 15.2  HCT 43.7 46.3  PLT 191 204    COAGS: Recent Labs    11/13/17 0757  INR 0.87  APTT 25    BMP: Recent Labs    11/09/17 1117  NA 141  K 4.2  CL 109  CO2 25  GLUCOSE 96  BUN 20  CALCIUM 9.3  CREATININE 1.02  GFRNONAA >60  GFRAA >60    LIVER FUNCTION TESTS: No results for input(s): BILITOT, AST, ALT, ALKPHOS, PROT, ALBUMIN in the last 8760 hours.  TUMOR MARKERS: No results for input(s): AFPTM, CEA, CA199, CHROMGRNA in the last 8760 hours.  Assessment and Plan: Patient with past medical history of esophageal cancer presents with complaint of right kidney stone requiring surgical intervention.  IR consulted for right PCN at the request of Dr. Karsten Ro. Case reviewed by Dr. Earleen Newport who approves patient for procedure.  Patient presents today in their usual state of health.  He has been NPO and is not currently on blood thinners.   Risks and benefits  were discussed with the patient including, but not limited to, infection, bleeding, significant bleeding causing loss or decrease in renal function or damage to adjacent structures.   All of the patient's questions were answered, patient is agreeable to proceed.  Consent signed and in chart.  Thank you for this interesting consult.  I greatly enjoyed meeting Peter Richmond and look forward to participating in their care.  A copy of this report was sent to the requesting provider on this date.  Electronically Signed: Docia Barrier, PA 11/13/2017, 8:49 AM   I spent a total of  30 Minutes   in face to face in clinical consultation, greater than 50% of which was  counseling/coordinating care for right renal calculi.

## 2017-11-13 NOTE — H&P (Signed)
HPI: Peter Richmond is a 84 year-old male with a large right renal calculus as well as another smaller stone in the same kidney as well.  The patient has not had any flank pain since they were last seen. The patient has developed urgency. He has no seen blood in his urine since the last visit. He is not currently having flank pain, back pain, groin pain, nausea, vomiting, fever or chills. He has had ESWL and Ureteral Stent for treatment of his stones in the past. This condition would be considered of mild to moderate severity with no modifying factors or associated signs or symptoms other than as noted above.   Calculus disease: A CT scan done in 5/08 revealed a single right lower pole renal calculus at that time.   CT scan done 12/07/12 revealed a 17 x 10 mm calcification on the right hand side is a stone located in the renal pelvis with Hounsfield units of approximately 1300.  Treatment: Stent placement and ESL 01/10/13. Retreatment 01/31/13.   Interval history 01/28/16:: He has been doing well from a urologic standpoint with no flank pain, hematuria or passage of stone since I saw him last. He said he does have some mild urgency at times but not to the point where he develops incontinence. He said he only gets up once at night if at all. He also mentioned that he gets a queasy feeling in his stomach sometimes in the morning and after he eats sweets.   02/17/17: He returns for follow-up of his right renal calculi. He reports he has not had any flank pain, hematuria or other symptoms to suggest the passage of the stone.   03/16/17: When I saw him for routine follow-up of his stones are noted some increase in size indicating metabolic activity and therefore recommended he undergo further evaluation with metabolic stone risk studies including both serum and 24-hour urine. He returns today to go over the results.   09/08/17: He has not been having any flank pain or hematuria.     ALLERGIES: Prevacid  SoluTab TBDP    MEDICATIONS: Omeprazole 20 mg capsule,delayed release  Carisoprodol 350 MG Oral Tablet 0 Oral  Cyclobenzaprine Hcl 10 mg tablet  Fish Oil Concentrate 1000 MG Oral Capsule Oral  Meclizine Hcl  Proair Hfa  Vitamin D 1000 UNIT Oral Tablet Oral     GU PSH: Cystoscopy Insert Stent - 2014 ESWL - 2014, 2014      PSH Notes: Lithotripsy, Cystoscopy With Insertion Of Ureteral Stent Right, Lithotripsy   NON-GU PSH: None   GU PMH: History of urolithiasis - 01/28/2016 Renal calculus (Stable), Right, His right renal calculi are stable. He has not developed any new stones. I will continue to monitor this on a yearly basis. - 01/28/2016, Kidney stone on right side, - 2016 Renal cyst, Renal cyst, acquired, left - 2014      PMH Notes: Left renal cyst: This is seen on CT scan done 5/08. It was a simple cyst.    NON-GU PMH: Encounter for general adult medical examination without abnormal findings, Encounter for preventive health examination - 2015 Asthma, Asthma - 2014 Other abnormal glucose, Prediabetes - 2014 Palmar fascial fibromatosis [Dupuytren], Dupuytren's Contracture - 2014 Personal history of other diseases of the circulatory system, History of mitral valve prolapse - 2014 Personal history of other diseases of the digestive system, History of esophageal reflux - 2014 Personal history of other diseases of the nervous system and sense organs, History of tinnitus -  2014 Personal history of other specified conditions, History of vertigo - 2014    FAMILY HISTORY: Pancreatic neoplasm - Father   SOCIAL HISTORY: Marital Status: Married Preferred Language: English; Ethnicity: Not Hispanic Or Latino; Race: White     Notes: Never A Smoker   REVIEW OF SYSTEMS:    GU Review Male:   Patient denies frequent urination, hard to postpone urination, burning/ pain with urination, get up at night to urinate, leakage of urine, stream starts and stops, trouble starting your stream, have to  strain to urinate , erection problems, and penile pain.  Gastrointestinal (Upper):   Patient denies nausea, vomiting, and indigestion/ heartburn.  Gastrointestinal (Lower):   Patient denies diarrhea and constipation.  Constitutional:   Patient denies fever, night sweats, weight loss, and fatigue.  Skin:   Patient denies skin rash/ lesion and itching.  Eyes:   Patient denies blurred vision and double vision.  Ears/ Nose/ Throat:   Patient denies sore throat and sinus problems.  Hematologic/Lymphatic:   Patient denies easy bruising and swollen glands.  Cardiovascular:   Patient denies leg swelling and chest pains.  Respiratory:   Patient denies cough and shortness of breath.  Endocrine:   Patient denies excessive thirst.  Musculoskeletal:   Patient denies back pain and joint pain.  Neurological:   Patient denies headaches and dizziness.  Psychologic:   Patient denies depression and anxiety.   VITAL SIGNS:    Weight 197 lb / 89.36 kg  Height 74 in / 187.96 cm  BP 109/68 mmHg  Pulse 106 /min  BMI 25.3 kg/m   Physical Exam  Constitutional: Well nourished and well developed . No acute distress.   ENT:. The ears and nose are normal in appearance.   Neck: The appearance of the neck is normal and no neck mass is present.   Pulmonary: No respiratory distress and normal respiratory rhythm and effort.   Cardiovascular: Heart rate and rhythm are normal . No peripheral edema.   Abdomen: The abdomen is soft and nontender. No masses are palpated. No CVA tenderness. No hernias are palpable. No hepatosplenomegaly noted.   Rectal: Rectal exam demonstrates normal sphincter tone, no tenderness and no masses. The prostate has no nodularity and is not tender. The left seminal vesicle is nonpalpable. The right seminal vesicle is nonpalpable. The perineum is normal on inspection.   Genitourinary: Examination of the penis demonstrates no discharge, no masses, no lesions and a normal meatus. The scrotum  is without lesions. The right epididymis is palpably normal and non-tender. The left epididymis is palpably normal and non-tender. The right testis is non-tender and without masses. The left testis is non-tender and without masses.   Lymphatics: The femoral and inguinal nodes are not enlarged or tender.   Skin: Normal skin turgor, no visible rash and no visible skin lesions.   Neuro/Psych:. Mood and affect are appropriate.    PAST DATA REVIEWED:  Source Of History:  Patient  Lab Test Review:   BUN/Creatinine  Records Review:   Previous Patient Records, POC Tool  X-Ray Review: KUB: Reviewed Films. Previous KUB images were reviewed and compared with today's study.   Notes:                     His creatinine in 10/18 was 0.5.   PROCEDURES:         KUB - [id number]  A single view of the abdomen is obtained.      The stone in his  right renal pelvis present. It has increased in size and is currently 17 x 12 mm. There also appears to be a small stone in the periphery. No left renal calculi are present.         Urinalysis Dipstick Dipstick Cont'd  Color: Yellow Bilirubin: Neg  Appearance: Clear Ketones: Neg  Specific Gravity: 1.025 Blood: Neg  pH: <=5.0 Protein: Trace  Glucose: Neg Urobilinogen: 0.2    Nitrites: Neg    Leukocyte Esterase: Neg    ASSESSMENT/PLAN:      ICD-10 Details  1 GU:   Renal calculus - N20.0 Right   We discussed the management of urinary stones. These options include observation, ureteroscopy, shockwave lithotripsy, and PCNL. We discussed which options are relevant to these particular stones. We discussed the natural history of stones as well as the complications of untreated stones and the impact on quality of life without treatment as well as with each of the above listed treatments. We also discussed the efficacy of each treatment in its ability to clear the stone burden. With any of these management options I discussed the signs and symptoms of infection and the  need for emergent treatment should these be experienced. For each option we discussed the ability of each procedure to clear the patient of their stone burden.   For observation I described the risks which include but are not limited to silent renal damage, life-threatening infection, need for emergent surgery, failure to pass stone, and pain.   For ureteroscopy I described the risks which include heart attack, stroke, pulmonary embolus, death, bleeding, infection, damage to contiguous structures, positioning injury, ureteral stricture, ureteral avulsion, ureteral injury, need for ureteral stent, inability to perform ureteroscopy, need for an interval procedure, inability to clear stone burden, stent discomfort and pain.   For shockwave lithotripsy I described the risks which include arrhythmia, kidney contusion, kidney hemorrhage, need for transfusion, long-term risk of diabetes or hypertension, back discomfort, flank ecchymosis, flank abrasion, inability to break up stone, inability to pass stone fragments, Steinstrasse, infection associated with obstructing stones, need for different surgical procedure, need for repeat shockwave lithotripsy, and death.   For PCNL I described the risks including heart attack, sure, pulmonary embolus, death, positioning injury, pneumothorax, hydrothorax, need for chest tube, inability to clear stone burden, renal laceration, arterial venous fistula or malformation, need for embolization of kidney, loss of kidney or renal function, need for repeat procedure, need for prolonged nephrostomy tube, ureteral avulsion, fistula.    Since he has had interval growth of the stone in his right kidney the decision has been to proceed with a right PCNL.

## 2017-11-13 NOTE — Discharge Instructions (Signed)

## 2017-11-13 NOTE — Transfer of Care (Signed)
Immediate Anesthesia Transfer of Care Note  Patient: Peter Richmond  Procedure(s) Performed: NEPHROLITHOTOMY PERCUTANEOUS (Right )  Patient Location: PACU  Anesthesia Type:General  Level of Consciousness: awake, alert  and oriented  Airway & Oxygen Therapy: Patient Spontanous Breathing and Patient connected to face mask oxygen  Post-op Assessment: Report given to RN and Post -op Vital signs reviewed and stable  Post vital signs: Reviewed and stable  Last Vitals:  Vitals Value Taken Time  BP 151/88 11/13/2017  1:13 PM  Temp    Pulse 61 11/13/2017  1:14 PM  Resp 15 11/13/2017  1:14 PM  SpO2 100 % 11/13/2017  1:14 PM  Vitals shown include unvalidated device data.  Last Pain:  Vitals:   11/13/17 0736  TempSrc: Oral         Complications: No apparent anesthesia complications

## 2017-11-13 NOTE — Anesthesia Postprocedure Evaluation (Signed)
Anesthesia Post Note  Patient: Peter Richmond  Procedure(s) Performed: NEPHROLITHOTOMY PERCUTANEOUS (Right )     Patient location during evaluation: PACU Anesthesia Type: General Level of consciousness: awake and alert Pain management: pain level controlled Vital Signs Assessment: post-procedure vital signs reviewed and stable Respiratory status: spontaneous breathing, nonlabored ventilation, respiratory function stable and patient connected to nasal cannula oxygen Cardiovascular status: blood pressure returned to baseline and stable Postop Assessment: no apparent nausea or vomiting Anesthetic complications: no    Last Vitals:  Vitals:   11/13/17 1345 11/13/17 1400  BP: (!) 163/79 (!) 163/83  Pulse: 65 (!) 58  Resp: 12 14  Temp:  (!) 36.4 C  SpO2: 98% 98%    Last Pain:  Vitals:   11/13/17 1400  TempSrc:   PainSc: 0-No pain                 Zauria Dombek COKER

## 2017-11-14 ENCOUNTER — Encounter (HOSPITAL_COMMUNITY): Payer: Self-pay | Admitting: Urology

## 2017-11-14 DIAGNOSIS — Z8679 Personal history of other diseases of the circulatory system: Secondary | ICD-10-CM | POA: Diagnosis not present

## 2017-11-14 DIAGNOSIS — Z87442 Personal history of urinary calculi: Secondary | ICD-10-CM | POA: Diagnosis not present

## 2017-11-14 DIAGNOSIS — Z8501 Personal history of malignant neoplasm of esophagus: Secondary | ICD-10-CM | POA: Diagnosis not present

## 2017-11-14 DIAGNOSIS — K219 Gastro-esophageal reflux disease without esophagitis: Secondary | ICD-10-CM | POA: Diagnosis not present

## 2017-11-14 DIAGNOSIS — Z87891 Personal history of nicotine dependence: Secondary | ICD-10-CM | POA: Diagnosis not present

## 2017-11-14 DIAGNOSIS — N200 Calculus of kidney: Secondary | ICD-10-CM | POA: Diagnosis not present

## 2017-11-14 DIAGNOSIS — Z79899 Other long term (current) drug therapy: Secondary | ICD-10-CM | POA: Diagnosis not present

## 2017-11-14 NOTE — Discharge Summary (Signed)
Physician Discharge Summary  Patient ID: Peter Richmond MRN: [id number] DOB/AGE: 05/12/1931 84 y.o.  Admit date: 11/13/2017 Discharge date: 11/14/2017  Admission Diagnoses:  Discharge Diagnoses:  Active Problems:   Renal calculus, right   Discharged Condition: good  Hospital Course: Patient underwent a PCNL yesterday.  he tolerated the procedure well and was stable postoperatively.  He was doing well the following day.  Foley catheter was removed and he was discharged home in stable condition  Consults: None  Significant Diagnostic Studies: None  Treatments: surgery: PCNL on the right  Discharge Exam: Blood pressure 126/71, pulse 61, temperature 98.3 F (36.8 C), temperature source Oral, resp. rate 18, height 6\' 3"  (1.905 m), weight 87.5 kg (193 lb), SpO2 96 %. General appearance: alert no acute distress Adequate perfusion of extremities Nonlabored respiration Abdomen soft nontender nondistended Incision site on the back clean dry and intact. Foley catheter draining light red urine  Disposition: Discharge disposition: 01-Home or Self Care        Allergies as of 11/14/2017      Reactions   Prevacid [lansoprazole] Rash      Medication List    TAKE these medications   cholecalciferol 1000 units tablet Commonly known as:  VITAMIN D Take 1,000 tablets by mouth daily.   Fish Oil 1200 MG Caps Take 1,200 mg by mouth daily.   HYDROcodone-acetaminophen 10-325 MG tablet Commonly known as:  NORCO Take 1-2 tablets by mouth every 4 (four) hours as needed for moderate pain. Maximum dose per 24 hours - 8 pills   omeprazole 20 MG capsule Commonly known as:  PRILOSEC Take 20 mg by mouth every evening.   tamsulosin 0.4 MG Caps capsule Commonly known as:  FLOMAX Take 1 capsule (0.4 mg total) by mouth daily after supper.      Follow-up Information    [facility] On 11/20/2017.   Why:  For your appiontment at 8:00 Contact information: [place]          Signed: Marton Redwood, III 11/14/2017, 10:17 AM

## 2017-11-14 NOTE — Progress Notes (Signed)
Patient voided 100cc. PVR via bladder scanner was 0 cc.  Peter Richmond. Brigitte Pulse, RN

## 2017-11-14 NOTE — Progress Notes (Signed)
Discharge teaching completed with teach back. Discharge handout given and reviewed with pt. Prescriptions given for Norco and Flomax along with medication information. Pt. has appointment scheduled with Urology and understands to follow up.

## 2017-11-20 DIAGNOSIS — N200 Calculus of kidney: Secondary | ICD-10-CM | POA: Diagnosis not present

## 2017-11-24 DIAGNOSIS — N200 Calculus of kidney: Secondary | ICD-10-CM | POA: Diagnosis not present

## 2017-12-30 DIAGNOSIS — E559 Vitamin D deficiency, unspecified: Secondary | ICD-10-CM | POA: Diagnosis not present

## 2017-12-30 DIAGNOSIS — Z79899 Other long term (current) drug therapy: Secondary | ICD-10-CM | POA: Diagnosis not present

## 2017-12-30 DIAGNOSIS — R238 Other skin changes: Secondary | ICD-10-CM | POA: Diagnosis not present

## 2017-12-30 DIAGNOSIS — Z8501 Personal history of malignant neoplasm of esophagus: Secondary | ICD-10-CM | POA: Diagnosis not present

## 2017-12-30 DIAGNOSIS — Z0000 Encounter for general adult medical examination without abnormal findings: Secondary | ICD-10-CM | POA: Diagnosis not present

## 2018-01-04 DIAGNOSIS — Z23 Encounter for immunization: Secondary | ICD-10-CM | POA: Diagnosis not present

## 2018-01-04 DIAGNOSIS — S1096XA Insect bite of unspecified part of neck, initial encounter: Secondary | ICD-10-CM | POA: Diagnosis not present

## 2018-01-04 DIAGNOSIS — Z0000 Encounter for general adult medical examination without abnormal findings: Secondary | ICD-10-CM | POA: Diagnosis not present

## 2018-02-10 DIAGNOSIS — L821 Other seborrheic keratosis: Secondary | ICD-10-CM | POA: Diagnosis not present

## 2018-02-10 DIAGNOSIS — H61001 Unspecified perichondritis of right external ear: Secondary | ICD-10-CM | POA: Diagnosis not present

## 2018-02-10 DIAGNOSIS — D2272 Melanocytic nevi of left lower limb, including hip: Secondary | ICD-10-CM | POA: Diagnosis not present

## 2018-02-10 DIAGNOSIS — D1801 Hemangioma of skin and subcutaneous tissue: Secondary | ICD-10-CM | POA: Diagnosis not present

## 2018-02-10 DIAGNOSIS — L918 Other hypertrophic disorders of the skin: Secondary | ICD-10-CM | POA: Diagnosis not present

## 2018-02-17 DIAGNOSIS — H8101 Meniere's disease, right ear: Secondary | ICD-10-CM | POA: Diagnosis not present

## 2018-02-17 DIAGNOSIS — H9041 Sensorineural hearing loss, unilateral, right ear, with unrestricted hearing on the contralateral side: Secondary | ICD-10-CM | POA: Diagnosis not present

## 2018-02-17 DIAGNOSIS — K219 Gastro-esophageal reflux disease without esophagitis: Secondary | ICD-10-CM | POA: Diagnosis not present

## 2018-02-17 DIAGNOSIS — R490 Dysphonia: Secondary | ICD-10-CM | POA: Diagnosis not present

## 2018-04-13 DIAGNOSIS — H1851 Endothelial corneal dystrophy: Secondary | ICD-10-CM | POA: Diagnosis not present

## 2018-04-13 DIAGNOSIS — Z961 Presence of intraocular lens: Secondary | ICD-10-CM | POA: Diagnosis not present

## 2018-04-13 DIAGNOSIS — H04123 Dry eye syndrome of bilateral lacrimal glands: Secondary | ICD-10-CM | POA: Diagnosis not present

## 2018-04-13 DIAGNOSIS — H26493 Other secondary cataract, bilateral: Secondary | ICD-10-CM | POA: Diagnosis not present

## 2018-04-13 DIAGNOSIS — H31092 Other chorioretinal scars, left eye: Secondary | ICD-10-CM | POA: Diagnosis not present

## 2018-04-13 DIAGNOSIS — H43391 Other vitreous opacities, right eye: Secondary | ICD-10-CM | POA: Diagnosis not present

## 2018-08-16 DIAGNOSIS — I781 Nevus, non-neoplastic: Secondary | ICD-10-CM | POA: Diagnosis not present

## 2018-08-16 DIAGNOSIS — C4492 Squamous cell carcinoma of skin, unspecified: Secondary | ICD-10-CM | POA: Diagnosis not present

## 2018-08-16 DIAGNOSIS — D485 Neoplasm of uncertain behavior of skin: Secondary | ICD-10-CM | POA: Diagnosis not present

## 2018-08-25 DIAGNOSIS — Z85828 Personal history of other malignant neoplasm of skin: Secondary | ICD-10-CM | POA: Diagnosis not present

## 2018-08-25 DIAGNOSIS — C44629 Squamous cell carcinoma of skin of left upper limb, including shoulder: Secondary | ICD-10-CM | POA: Diagnosis not present

## 2018-08-25 DIAGNOSIS — D485 Neoplasm of uncertain behavior of skin: Secondary | ICD-10-CM | POA: Diagnosis not present

## 2018-12-21 DIAGNOSIS — L255 Unspecified contact dermatitis due to plants, except food: Secondary | ICD-10-CM | POA: Diagnosis not present

## 2019-01-10 DIAGNOSIS — E538 Deficiency of other specified B group vitamins: Secondary | ICD-10-CM | POA: Diagnosis not present

## 2019-01-10 DIAGNOSIS — R748 Abnormal levels of other serum enzymes: Secondary | ICD-10-CM | POA: Diagnosis not present

## 2019-01-13 DIAGNOSIS — Z8501 Personal history of malignant neoplasm of esophagus: Secondary | ICD-10-CM | POA: Diagnosis not present

## 2019-01-13 DIAGNOSIS — I341 Nonrheumatic mitral (valve) prolapse: Secondary | ICD-10-CM | POA: Diagnosis not present

## 2019-01-13 DIAGNOSIS — Z0000 Encounter for general adult medical examination without abnormal findings: Secondary | ICD-10-CM | POA: Diagnosis not present

## 2019-01-13 DIAGNOSIS — L120 Bullous pemphigoid: Secondary | ICD-10-CM | POA: Diagnosis not present

## 2019-01-13 DIAGNOSIS — I4510 Unspecified right bundle-branch block: Secondary | ICD-10-CM | POA: Diagnosis not present

## 2019-01-13 DIAGNOSIS — K4090 Unilateral inguinal hernia, without obstruction or gangrene, not specified as recurrent: Secondary | ICD-10-CM | POA: Diagnosis not present

## 2019-01-13 DIAGNOSIS — I440 Atrioventricular block, first degree: Secondary | ICD-10-CM | POA: Diagnosis not present

## 2019-01-13 DIAGNOSIS — Q676 Pectus excavatum: Secondary | ICD-10-CM | POA: Diagnosis not present

## 2019-01-13 DIAGNOSIS — N200 Calculus of kidney: Secondary | ICD-10-CM | POA: Diagnosis not present

## 2019-01-13 DIAGNOSIS — M720 Palmar fascial fibromatosis [Dupuytren]: Secondary | ICD-10-CM | POA: Diagnosis not present

## 2019-01-20 DIAGNOSIS — Z23 Encounter for immunization: Secondary | ICD-10-CM | POA: Diagnosis not present

## 2019-02-25 DIAGNOSIS — D3617 Benign neoplasm of peripheral nerves and autonomic nervous system of trunk, unspecified: Secondary | ICD-10-CM | POA: Diagnosis not present

## 2019-02-25 DIAGNOSIS — D1801 Hemangioma of skin and subcutaneous tissue: Secondary | ICD-10-CM | POA: Diagnosis not present

## 2019-02-25 DIAGNOSIS — L821 Other seborrheic keratosis: Secondary | ICD-10-CM | POA: Diagnosis not present

## 2019-02-25 DIAGNOSIS — D225 Melanocytic nevi of trunk: Secondary | ICD-10-CM | POA: Diagnosis not present

## 2019-02-25 DIAGNOSIS — L814 Other melanin hyperpigmentation: Secondary | ICD-10-CM | POA: Diagnosis not present

## 2019-02-25 DIAGNOSIS — D2272 Melanocytic nevi of left lower limb, including hip: Secondary | ICD-10-CM | POA: Diagnosis not present

## 2019-02-25 DIAGNOSIS — L570 Actinic keratosis: Secondary | ICD-10-CM | POA: Diagnosis not present

## 2019-02-25 DIAGNOSIS — Z85828 Personal history of other malignant neoplasm of skin: Secondary | ICD-10-CM | POA: Diagnosis not present

## 2019-03-01 DIAGNOSIS — R69 Illness, unspecified: Secondary | ICD-10-CM | POA: Diagnosis not present

## 2019-04-07 ENCOUNTER — Other Ambulatory Visit: Payer: Self-pay

## 2019-04-07 DIAGNOSIS — Z20822 Contact with and (suspected) exposure to covid-19: Secondary | ICD-10-CM

## 2019-04-09 LAB — NOVEL CORONAVIRUS, NAA: SARS-CoV-2, NAA: NOT DETECTED

## 2019-04-11 ENCOUNTER — Telehealth: Payer: Self-pay

## 2019-04-11 NOTE — Telephone Encounter (Signed)
Caller given negative result and verbalized understanding  

## 2019-04-14 DIAGNOSIS — Z961 Presence of intraocular lens: Secondary | ICD-10-CM | POA: Diagnosis not present

## 2019-04-14 DIAGNOSIS — H524 Presbyopia: Secondary | ICD-10-CM | POA: Diagnosis not present

## 2019-04-14 DIAGNOSIS — Q141 Congenital malformation of retina: Secondary | ICD-10-CM | POA: Diagnosis not present

## 2019-04-14 DIAGNOSIS — H5213 Myopia, bilateral: Secondary | ICD-10-CM | POA: Diagnosis not present

## 2019-04-14 DIAGNOSIS — H26493 Other secondary cataract, bilateral: Secondary | ICD-10-CM | POA: Diagnosis not present

## 2019-04-14 DIAGNOSIS — H52203 Unspecified astigmatism, bilateral: Secondary | ICD-10-CM | POA: Diagnosis not present

## 2019-04-14 DIAGNOSIS — H18513 Endothelial corneal dystrophy, bilateral: Secondary | ICD-10-CM | POA: Diagnosis not present

## 2019-04-14 DIAGNOSIS — H04123 Dry eye syndrome of bilateral lacrimal glands: Secondary | ICD-10-CM | POA: Diagnosis not present

## 2019-04-15 DIAGNOSIS — Z833 Family history of diabetes mellitus: Secondary | ICD-10-CM | POA: Diagnosis not present

## 2019-04-15 DIAGNOSIS — M62830 Muscle spasm of back: Secondary | ICD-10-CM | POA: Diagnosis not present

## 2019-04-15 DIAGNOSIS — Z87891 Personal history of nicotine dependence: Secondary | ICD-10-CM | POA: Diagnosis not present

## 2019-04-15 DIAGNOSIS — K219 Gastro-esophageal reflux disease without esophagitis: Secondary | ICD-10-CM | POA: Diagnosis not present

## 2019-04-15 DIAGNOSIS — G8929 Other chronic pain: Secondary | ICD-10-CM | POA: Diagnosis not present

## 2019-04-15 DIAGNOSIS — Z8249 Family history of ischemic heart disease and other diseases of the circulatory system: Secondary | ICD-10-CM | POA: Diagnosis not present

## 2019-04-15 DIAGNOSIS — Z888 Allergy status to other drugs, medicaments and biological substances status: Secondary | ICD-10-CM | POA: Diagnosis not present

## 2019-10-26 DIAGNOSIS — R413 Other amnesia: Secondary | ICD-10-CM | POA: Diagnosis not present

## 2019-10-26 DIAGNOSIS — Z87898 Personal history of other specified conditions: Secondary | ICD-10-CM | POA: Diagnosis not present

## 2019-10-26 DIAGNOSIS — R2689 Other abnormalities of gait and mobility: Secondary | ICD-10-CM | POA: Diagnosis not present

## 2019-11-30 DIAGNOSIS — L120 Bullous pemphigoid: Secondary | ICD-10-CM | POA: Diagnosis not present

## 2019-12-14 DIAGNOSIS — B880 Other acariasis: Secondary | ICD-10-CM | POA: Diagnosis not present

## 2019-12-14 DIAGNOSIS — L120 Bullous pemphigoid: Secondary | ICD-10-CM | POA: Diagnosis not present

## 2019-12-15 ENCOUNTER — Encounter: Payer: Self-pay | Admitting: *Deleted

## 2019-12-16 ENCOUNTER — Ambulatory Visit: Payer: Medicare HMO | Admitting: Neurology

## 2019-12-16 ENCOUNTER — Encounter: Payer: Self-pay | Admitting: Neurology

## 2019-12-16 VITALS — BP 150/77 | HR 68 | Ht 75.0 in | Wt 190.5 lb

## 2019-12-16 DIAGNOSIS — G603 Idiopathic progressive neuropathy: Secondary | ICD-10-CM | POA: Diagnosis not present

## 2019-12-16 DIAGNOSIS — R269 Unspecified abnormalities of gait and mobility: Secondary | ICD-10-CM | POA: Diagnosis not present

## 2019-12-16 DIAGNOSIS — E538 Deficiency of other specified B group vitamins: Secondary | ICD-10-CM

## 2019-12-16 NOTE — Progress Notes (Signed)
Reason for visit: Gait disturbance  Referring physician: Dr. Marene Richmond is a 86 y.o. male  History of present illness:  Peter Richmond is an 86 year old right-handed white male with a history of a gait disturbance.  The patient indicates that in 2009 he had a bout of vertigo and was seen by Dr. Benjamine Richmond.  MRI of the brain was done around that time it was relatively unremarkable.  He was told to reduce his sodium in the diet and he eventually resolved the vertigo and has not had any issues with this since.  The patient indicates that in the latter part of July or early August he began having problems with gait instability.  He denied any vertigo per se.  The patient does wear hearing aids and he has not noted any change in hearing or ear pain.  The patient decided to once again reduce the salt in his diet and over the last couple weeks he has improved significantly to the point where he feels that he is at or very near his baseline.  At 1 point, he was feeling unsteady on his feet, he was staggering, but he did not fall.  The patient reported no new numbness or weakness of the face, arms, legs.  He has had a 5 or 6-year history of some numbness in the feet, he denies any burning or stinging in the feet at night.  He does however have a baseline slight gait instability associated with this.  When he is in the shower and closes his eyes, he has to keep his feet wide apart in order to maintain balance.  He does not use a cane or a walker for ambulation.  The patient denies any numbness in the hands, he does have some low back pain at times and denies any neck pain.  He denies any sciatica type pain down either leg.  He claims that his bladder control is good, he does have some "loose bowels".  He has a history of a vitamin B12 deficiency, he is on oral B12 supplementation.  He is sent to this office for an evaluation.  The patient reported no headache, dizziness or vertigo, vision changes, confusion, or  slurred speech.  He does have some trouble swallowing at times due to an esophageal stricture associated with prior esophageal cancer.  Past Medical History:  Diagnosis Date  . Calculus of right kidney   . Cancer ([place])    hx of esophageal cancer- 2006   . GERD (gastroesophageal reflux disease)   . History of esophageal cancer    S/P ESOPHAGECTOMY 2006 (STAGE I)  NO CHEMORADIATION--  NO RECURRENCE  . History of kidney stones   . Poison oak    recent exposure at time of preop on 11/09/17 - on right arm and on back of legs per patient- almost healed   . Right bundle branch block     Past Surgical History:  Procedure Laterality Date  . cataract surgery      bilateral   . CYSTOSCOPY W/ URETERAL STENT PLACEMENT Right 01/10/2013   Procedure: CYSTOSCOPY WITH RETROGRADE PYELOGRAM/URETERAL STENT PLACEMENT;  Surgeon: Claybon Jabs, MD;  Location: [facility];  Service: Urology;  Laterality: Right;  . IR URETERAL STENT RIGHT NEW ACCESS W/O SEP NEPHROSTOMY CATH  11/13/2017  . LAPAROSCOPIC CHOLECYSTECTOMY  06-24-2005  . NEPHROLITHOTOMY Right 11/13/2017   Procedure: NEPHROLITHOTOMY PERCUTANEOUS;  Surgeon: Kathie Rhodes, MD;  Location: [facility];  Service: Urology;  Laterality: Right;  . TONSILLECTOMY  1943  . TRANSHIATAL ESOPHAGECTOMY/ JEJUNOSTOMY/ PYLOROPLASTY  03-31-2005  DR Arlyce Dice   ESOPHAGEAL CANCER--  NO RECURRENCE    Family History  Problem Relation Age of Onset  . Pancreatic cancer Father   . Parkinson's disease Sister     Social history:  reports that he quit smoking about 35 years ago. His smoking use included cigarettes. He quit after 20.00 years of use. He has never used smokeless tobacco. He reports that he does not drink alcohol and does not use drugs.  Medications:  Prior to Admission medications   Medication Sig Start Date End Date Taking? Authorizing Provider  cholecalciferol (VITAMIN D) 1000 units tablet Take 1,000 tablets by mouth daily.    Yes [provider]  loperamide (IMODIUM) 2 MG capsule Take by mouth as needed for diarrhea or loose stools. 3 x weekly   Yes [provider]  Omega-3 Fatty Acids (FISH OIL) 1200 MG CAPS Take 1,200 mg by mouth daily.    Yes [provider]  omeprazole (PRILOSEC) 20 MG capsule Take 20 mg by mouth every evening.    Yes [provider]  vitamin B-12 (CYANOCOBALAMIN) 1000 MCG tablet Take 1,000 mcg by mouth daily.   Yes [provider]  HYDROcodone-acetaminophen (NORCO) 10-325 MG tablet Take 1-2 tablets by mouth every 4 (four) hours as needed for moderate pain. Maximum dose per 24 hours - 8 pills Patient not taking: Reported on 12/16/2019 11/13/17   Kathie Rhodes, MD  tamsulosin (FLOMAX) 0.4 MG CAPS capsule Take 1 capsule (0.4 mg total) by mouth daily after supper. Patient not taking: Reported on 12/16/2019 11/13/17   Kathie Rhodes, MD      Allergies  Allergen Reactions  . Other Rash  . Prevacid [Lansoprazole] Rash    ROS:  Out of a complete 14 system review of symptoms, the patient complains only of the following symptoms, and all other reviewed systems are negative.  Walking difficulty Numbness in the feet Easy bruising Hearing loss, hearing aids  Blood pressure (!) 150/77, pulse 68, height 6\' 3"  (1.905 m), weight 190 lb 8 oz (86.4 kg).  Physical Exam  General: The patient is alert and cooperative at the time of the examination.  Eyes: Pupils are equal, round, and reactive to light. Discs are flat bilaterally.  Neck: The neck is supple, no carotid bruits are noted.  Respiratory: The respiratory examination is clear.  Cardiovascular: The cardiovascular examination reveals a regular rate and rhythm, no obvious murmurs or rubs are noted.  Skin: Extremities are without significant edema.  Varicose veins are noted on both lower extremities, left greater than right.  Neurologic Exam  Mental status: The patient is alert and oriented x 3 at the time of the  examination. The patient has apparent normal recent and remote memory, with an apparently normal attention span and concentration ability.  Cranial nerves: Facial symmetry is present. There is good sensation of the face to pinprick and soft touch bilaterally. The strength of the facial muscles and the muscles to head turning and shoulder shrug are normal bilaterally. Speech is well enunciated, no aphasia or dysarthria is noted. Extraocular movements are full. Visual fields are full. The tongue is midline, and the patient has symmetric elevation of the soft palate. No obvious hearing deficits are noted.  Motor: The motor testing reveals 5 over 5 strength of all 4 extremities. Good symmetric motor tone is noted throughout.  Sensory: Sensory testing is intact to pinprick, soft  touch, vibration sensation, and position sense on the upper extremities.  With the lower extremities there is no definite stocking pattern pinprick sensory deficit but there is a significant decrease in vibration sensation in both feet, position sense is impaired in the left greater than right foot.  No evidence of extinction is noted.  Coordination: Cerebellar testing reveals good finger-nose-finger and heel-to-shin bilaterally.  Gait and station: Gait is normal. Tandem gait is slightly unsteady. Romberg is negative. No drift is seen.  Reflexes: Deep tendon reflexes are symmetric, but are somewhat depressed bilaterally. Toes are downgoing bilaterally.   MRI brain 01/16/08:  IMPRESSION:  1. No evidence of acute ischemia.  2. A few scattered subcortical white matter hyperintensities.  These are nonspecific in terms of etiology. Differential  considerations could be changes of ischemic gliosis related to  small vessel disease due to hypertension and/or diabetes versus a  demyelinating process or vasculitis.  3. Moderate soft tissue signal, probably inflammatory, involving  the maxillary sinuses, the ethmoid air cells, but  more prominently  the frontal sinuses.  4. Soft tissue smooth prominence of the nasopharynx in the midline  which may represent atypical Tornwaldt cysts versus inspissated  secretions in the salivary glands.   * MRI scan images were reviewed online. I agree with the written report.     Assessment/Plan:  1.  Transient episode of gait instability  2.  Numbness in the feet, probable peripheral neuropathy  3.  History of vitamin B12 deficiency on oral supplementation.  The patient appears to have a history of baseline gait instability associated with what is probably a mild to moderate peripheral neuropathy.  The patient apparently has a history of borderline diabetes.  Blood work will be done looking for other causes of neuropathy.  The patient however had a transient worsening of his gait instability within the last 3 weeks.  Given this history, I will check MRI of the brain to exclude a small vessel ischemic stroke.  I will contact the patient once this study is done.  Peter Alexanders MD 12/16/2019 9:39 AM  [facility] [address] [place], [place] [address]  Phone [phone] Fax [phone]

## 2019-12-19 ENCOUNTER — Telehealth: Payer: Self-pay | Admitting: Neurology

## 2019-12-19 NOTE — Telephone Encounter (Signed)
[employer] medicare order sent to [facility]. They will obtain the auth and reach out to the patient to schedule.  °

## 2019-12-21 LAB — MULTIPLE MYELOMA PANEL, SERUM
Albumin SerPl Elph-Mcnc: 3.6 g/dL (ref 2.9–4.4)
Albumin/Glob SerPl: 1.3 (ref 0.7–1.7)
Alpha 1: 0.2 g/dL (ref 0.0–0.4)
Alpha2 Glob SerPl Elph-Mcnc: 0.8 g/dL (ref 0.4–1.0)
B-Globulin SerPl Elph-Mcnc: 1.1 g/dL (ref 0.7–1.3)
Gamma Glob SerPl Elph-Mcnc: 0.7 g/dL (ref 0.4–1.8)
Globulin, Total: 2.8 g/dL (ref 2.2–3.9)
IgA/Immunoglobulin A, Serum: 192 mg/dL (ref 61–437)
IgG (Immunoglobin G), Serum: 615 mg/dL (ref 603–1613)
IgM (Immunoglobulin M), Srm: 55 mg/dL (ref 15–143)
Total Protein: 6.4 g/dL (ref 6.0–8.5)

## 2019-12-21 LAB — ANA W/REFLEX: Anti Nuclear Antibody (ANA): NEGATIVE

## 2019-12-21 LAB — VITAMIN B12: Vitamin B-12: 1070 pg/mL (ref 232–1245)

## 2019-12-21 LAB — ANGIOTENSIN CONVERTING ENZYME: Angio Convert Enzyme: 42 U/L (ref 14–82)

## 2019-12-21 LAB — COPPER, SERUM: Copper: 114 ug/dL (ref 69–132)

## 2019-12-21 LAB — B. BURGDORFI ANTIBODIES: Lyme IgG/IgM Ab: <0.91 {ISR} (ref 0.00–0.90)

## 2019-12-21 LAB — RPR: RPR Ser Ql: NONREACTIVE

## 2019-12-25 ENCOUNTER — Ambulatory Visit
Admission: RE | Admit: 2019-12-25 | Discharge: 2019-12-25 | Disposition: A | Discharge status: Home / Self Care | Payer: Medicare HMO | Source: Ambulatory Visit | Attending: Neurology | Admitting: Neurology

## 2019-12-25 DIAGNOSIS — R269 Unspecified abnormalities of gait and mobility: Secondary | ICD-10-CM | POA: Diagnosis not present

## 2019-12-26 ENCOUNTER — Telehealth: Payer: Self-pay | Admitting: Neurology

## 2019-12-26 NOTE — Telephone Encounter (Signed)
I called the patient.  MRI of the brain does not show any stroke event that would explain the transient sudden onset of walking problems.  The patient does have a baseline peripheral neuropathy that can result in a gradual slow change in balance over time.  He will let me know if he has any sudden changes in the future.   MRI brain 12/25/19:  IMPRESSION:   This MRI of the brain without contrast shows the following: 1.   Generalized cortical atrophy that is most pronounced in the posterior frontal lobes in the parietal lobes.  This has progressed compared to the 2009 MRI. 2.   Small number of scattered T2/FLAIR hyperintense foci in the subcortical and deep white matter of the hemispheres consistent with mild chronic microvascular ischemic change, typical for age and just minimally progressed compared to the 2009 MRI. 3.   There were no acute findings.

## 2020-01-19 DIAGNOSIS — I10 Essential (primary) hypertension: Secondary | ICD-10-CM | POA: Diagnosis not present

## 2020-01-23 DIAGNOSIS — K219 Gastro-esophageal reflux disease without esophagitis: Secondary | ICD-10-CM | POA: Diagnosis not present

## 2020-01-23 DIAGNOSIS — Z791 Long term (current) use of non-steroidal anti-inflammatories (NSAID): Secondary | ICD-10-CM | POA: Diagnosis not present

## 2020-01-23 DIAGNOSIS — M1990 Unspecified osteoarthritis, unspecified site: Secondary | ICD-10-CM | POA: Diagnosis not present

## 2020-01-23 DIAGNOSIS — G8929 Other chronic pain: Secondary | ICD-10-CM | POA: Diagnosis not present

## 2020-01-23 DIAGNOSIS — R42 Dizziness and giddiness: Secondary | ICD-10-CM | POA: Diagnosis not present

## 2020-01-26 DIAGNOSIS — Z8249 Family history of ischemic heart disease and other diseases of the circulatory system: Secondary | ICD-10-CM | POA: Diagnosis not present

## 2020-01-26 DIAGNOSIS — I4510 Unspecified right bundle-branch block: Secondary | ICD-10-CM | POA: Diagnosis not present

## 2020-01-26 DIAGNOSIS — Z8501 Personal history of malignant neoplasm of esophagus: Secondary | ICD-10-CM | POA: Diagnosis not present

## 2020-01-26 DIAGNOSIS — G629 Polyneuropathy, unspecified: Secondary | ICD-10-CM | POA: Diagnosis not present

## 2020-01-26 DIAGNOSIS — I10 Essential (primary) hypertension: Secondary | ICD-10-CM | POA: Diagnosis not present

## 2020-01-26 DIAGNOSIS — L120 Bullous pemphigoid: Secondary | ICD-10-CM | POA: Diagnosis not present

## 2020-01-26 DIAGNOSIS — N200 Calculus of kidney: Secondary | ICD-10-CM | POA: Diagnosis not present

## 2020-01-26 DIAGNOSIS — I700 Atherosclerosis of aorta: Secondary | ICD-10-CM | POA: Diagnosis not present

## 2020-01-26 DIAGNOSIS — Z23 Encounter for immunization: Secondary | ICD-10-CM | POA: Diagnosis not present

## 2020-01-26 DIAGNOSIS — Z0001 Encounter for general adult medical examination with abnormal findings: Secondary | ICD-10-CM | POA: Diagnosis not present

## 2020-02-02 DIAGNOSIS — I700 Atherosclerosis of aorta: Secondary | ICD-10-CM | POA: Diagnosis not present

## 2020-02-02 DIAGNOSIS — K760 Fatty (change of) liver, not elsewhere classified: Secondary | ICD-10-CM | POA: Diagnosis not present

## 2020-02-02 DIAGNOSIS — N200 Calculus of kidney: Secondary | ICD-10-CM | POA: Diagnosis not present

## 2020-02-02 DIAGNOSIS — Z8249 Family history of ischemic heart disease and other diseases of the circulatory system: Secondary | ICD-10-CM | POA: Diagnosis not present

## 2020-02-10 ENCOUNTER — Telehealth: Payer: Self-pay | Admitting: Neurology

## 2020-02-10 NOTE — Telephone Encounter (Signed)
I have received the results of blood work done on this patient on 19 January 2020. White blood count is 9.0, hematocrit of 13.7, hematocrit 43.1, platelets of 203, MCV of 88.7. Sodium is 140, glucose 101, BUN of 25, creatinine 1.2, albumin of 3.8, alk phos of 113, potassium 4.3, chloride of 104, CO2 29, calcium 8.7, total protein 6.6, AST and ALT are normal.

## 2020-03-02 DIAGNOSIS — Z85828 Personal history of other malignant neoplasm of skin: Secondary | ICD-10-CM | POA: Diagnosis not present

## 2020-03-02 DIAGNOSIS — D2272 Melanocytic nevi of left lower limb, including hip: Secondary | ICD-10-CM | POA: Diagnosis not present

## 2020-03-02 DIAGNOSIS — L918 Other hypertrophic disorders of the skin: Secondary | ICD-10-CM | POA: Diagnosis not present

## 2020-03-02 DIAGNOSIS — L570 Actinic keratosis: Secondary | ICD-10-CM | POA: Diagnosis not present

## 2020-03-02 DIAGNOSIS — D3617 Benign neoplasm of peripheral nerves and autonomic nervous system of trunk, unspecified: Secondary | ICD-10-CM | POA: Diagnosis not present

## 2020-03-02 DIAGNOSIS — L821 Other seborrheic keratosis: Secondary | ICD-10-CM | POA: Diagnosis not present

## 2020-03-02 DIAGNOSIS — D1801 Hemangioma of skin and subcutaneous tissue: Secondary | ICD-10-CM | POA: Diagnosis not present

## 2020-03-08 DIAGNOSIS — R69 Illness, unspecified: Secondary | ICD-10-CM | POA: Diagnosis not present

## 2020-03-08 DIAGNOSIS — I700 Atherosclerosis of aorta: Secondary | ICD-10-CM | POA: Diagnosis not present

## 2020-03-13 DIAGNOSIS — N200 Calculus of kidney: Secondary | ICD-10-CM | POA: Diagnosis not present

## 2020-03-13 DIAGNOSIS — I700 Atherosclerosis of aorta: Secondary | ICD-10-CM | POA: Diagnosis not present

## 2020-03-21 DIAGNOSIS — R69 Illness, unspecified: Secondary | ICD-10-CM | POA: Diagnosis not present

## 2020-04-16 DIAGNOSIS — H43391 Other vitreous opacities, right eye: Secondary | ICD-10-CM | POA: Diagnosis not present

## 2020-04-16 DIAGNOSIS — H18513 Endothelial corneal dystrophy, bilateral: Secondary | ICD-10-CM | POA: Diagnosis not present

## 2020-04-16 DIAGNOSIS — H04123 Dry eye syndrome of bilateral lacrimal glands: Secondary | ICD-10-CM | POA: Diagnosis not present

## 2020-04-16 DIAGNOSIS — Z961 Presence of intraocular lens: Secondary | ICD-10-CM | POA: Diagnosis not present

## 2020-04-16 DIAGNOSIS — Q141 Congenital malformation of retina: Secondary | ICD-10-CM | POA: Diagnosis not present

## 2020-04-16 DIAGNOSIS — H26493 Other secondary cataract, bilateral: Secondary | ICD-10-CM | POA: Diagnosis not present

## 2020-05-08 ENCOUNTER — Other Ambulatory Visit: Payer: Medicare HMO

## 2020-05-08 DIAGNOSIS — Z20822 Contact with and (suspected) exposure to covid-19: Secondary | ICD-10-CM | POA: Diagnosis not present

## 2020-05-10 LAB — SARS-COV-2, NAA 2 DAY TAT

## 2020-05-10 LAB — NOVEL CORONAVIRUS, NAA: SARS-CoV-2, NAA: NOT DETECTED

## 2020-06-11 DIAGNOSIS — R197 Diarrhea, unspecified: Secondary | ICD-10-CM | POA: Diagnosis not present

## 2020-06-11 DIAGNOSIS — K219 Gastro-esophageal reflux disease without esophagitis: Secondary | ICD-10-CM | POA: Diagnosis not present

## 2020-06-11 DIAGNOSIS — R110 Nausea: Secondary | ICD-10-CM | POA: Diagnosis not present

## 2020-07-09 DIAGNOSIS — R197 Diarrhea, unspecified: Secondary | ICD-10-CM | POA: Diagnosis not present

## 2020-07-09 DIAGNOSIS — R1310 Dysphagia, unspecified: Secondary | ICD-10-CM | POA: Diagnosis not present

## 2020-07-09 DIAGNOSIS — K219 Gastro-esophageal reflux disease without esophagitis: Secondary | ICD-10-CM | POA: Diagnosis not present

## 2020-07-26 DIAGNOSIS — K222 Esophageal obstruction: Secondary | ICD-10-CM | POA: Diagnosis not present

## 2020-07-26 DIAGNOSIS — R1310 Dysphagia, unspecified: Secondary | ICD-10-CM | POA: Diagnosis not present

## 2021-01-25 DIAGNOSIS — I700 Atherosclerosis of aorta: Secondary | ICD-10-CM | POA: Diagnosis not present

## 2021-01-25 DIAGNOSIS — R739 Hyperglycemia, unspecified: Secondary | ICD-10-CM | POA: Diagnosis not present

## 2021-01-30 DIAGNOSIS — L120 Bullous pemphigoid: Secondary | ICD-10-CM | POA: Diagnosis not present

## 2021-01-30 DIAGNOSIS — K4090 Unilateral inguinal hernia, without obstruction or gangrene, not specified as recurrent: Secondary | ICD-10-CM | POA: Diagnosis not present

## 2021-01-30 DIAGNOSIS — R197 Diarrhea, unspecified: Secondary | ICD-10-CM | POA: Diagnosis not present

## 2021-01-30 DIAGNOSIS — N1831 Chronic kidney disease, stage 3a: Secondary | ICD-10-CM | POA: Diagnosis not present

## 2021-01-30 DIAGNOSIS — R0989 Other specified symptoms and signs involving the circulatory and respiratory systems: Secondary | ICD-10-CM | POA: Diagnosis not present

## 2021-01-30 DIAGNOSIS — G629 Polyneuropathy, unspecified: Secondary | ICD-10-CM | POA: Diagnosis not present

## 2021-01-30 DIAGNOSIS — R7303 Prediabetes: Secondary | ICD-10-CM | POA: Diagnosis not present

## 2021-01-30 DIAGNOSIS — Z0000 Encounter for general adult medical examination without abnormal findings: Secondary | ICD-10-CM | POA: Diagnosis not present

## 2021-01-30 DIAGNOSIS — Z23 Encounter for immunization: Secondary | ICD-10-CM | POA: Diagnosis not present

## 2021-01-30 DIAGNOSIS — I700 Atherosclerosis of aorta: Secondary | ICD-10-CM | POA: Diagnosis not present

## 2021-02-06 DIAGNOSIS — R490 Dysphonia: Secondary | ICD-10-CM | POA: Diagnosis not present

## 2021-02-06 DIAGNOSIS — R110 Nausea: Secondary | ICD-10-CM | POA: Diagnosis not present

## 2021-03-12 DIAGNOSIS — D3617 Benign neoplasm of peripheral nerves and autonomic nervous system of trunk, unspecified: Secondary | ICD-10-CM | POA: Diagnosis not present

## 2021-03-12 DIAGNOSIS — D2261 Melanocytic nevi of right upper limb, including shoulder: Secondary | ICD-10-CM | POA: Diagnosis not present

## 2021-03-12 DIAGNOSIS — L918 Other hypertrophic disorders of the skin: Secondary | ICD-10-CM | POA: Diagnosis not present

## 2021-03-12 DIAGNOSIS — D1801 Hemangioma of skin and subcutaneous tissue: Secondary | ICD-10-CM | POA: Diagnosis not present

## 2021-03-12 DIAGNOSIS — D2272 Melanocytic nevi of left lower limb, including hip: Secondary | ICD-10-CM | POA: Diagnosis not present

## 2021-03-12 DIAGNOSIS — L570 Actinic keratosis: Secondary | ICD-10-CM | POA: Diagnosis not present

## 2021-03-12 DIAGNOSIS — H61001 Unspecified perichondritis of right external ear: Secondary | ICD-10-CM | POA: Diagnosis not present

## 2021-03-12 DIAGNOSIS — L821 Other seborrheic keratosis: Secondary | ICD-10-CM | POA: Diagnosis not present

## 2021-03-12 DIAGNOSIS — D225 Melanocytic nevi of trunk: Secondary | ICD-10-CM | POA: Diagnosis not present

## 2021-03-12 DIAGNOSIS — Z85828 Personal history of other malignant neoplasm of skin: Secondary | ICD-10-CM | POA: Diagnosis not present

## 2021-03-13 ENCOUNTER — Telehealth: Payer: Self-pay | Admitting: *Deleted

## 2021-03-13 DIAGNOSIS — K4090 Unilateral inguinal hernia, without obstruction or gangrene, not specified as recurrent: Secondary | ICD-10-CM | POA: Diagnosis not present

## 2021-03-13 NOTE — Telephone Encounter (Signed)
Our office received a clearance request for the pt, who will be a NEW PT with our practice. I called Dr. Gurney Maxin office and s/w Maudie Mercury who verified referral will be sent over soon.     Pre-operative Risk Assessment    Patient Name: Peter Richmond  DOB: Mar 07, 1932 MRN: [id number]     PT WILL REQUIRE A NEW PT APPT. PER DR. Amie Portland OFFICE REFERRAL WILL BE SENT TO OUR OFFICE SOON. I WILL SEND A MESSAGE TO OUR CHART PREP/REFERRAL TEAM. I HAVE SCHEDULED THE PT AS A NEW PT WITH DR. Acie Fredrickson 03/19/21 @ 11:40 OK PER KELLY Madilyn Fireman, RN CLINIC SUPERVISOR; PT AWARE OF APPT.  Request for Surgical Clearance   Procedure:   OPEN LEFT INGUINAL HERNIA REPAIR WITH MESH  Date of Surgery: Clearance TBD                                 Surgeon:  DR. Gurney Maxin Surgeon's Group or Practice Name:  [facility] Phone number:  [phone] Fax number:  [phone] ATTN: Emeline Gins, CMA   Type of Clearance Requested: - Medical    Type of Anesthesia:   General    Additional requests/questions:   Jiles Prows   03/13/2021, 2:33 PM

## 2021-03-18 ENCOUNTER — Encounter: Payer: Self-pay | Admitting: Cardiovascular Disease

## 2021-03-18 NOTE — Progress Notes (Signed)
Cardiology Office Note:    Date:  03/19/2021   ID:  Peter Richmond, DOB [birth year], MRN [id number]  PCP:  Peter Pretty, MD   [facility] Providers Cardiologist:  new to Peter Richmond  Click to update primary MD,subspecialty MD or APP then REFRESH:1}    Referring MD: Peter Pretty, MD   Chief Complaint  Patient presents with   Pre-op Exam         Peter Richmond is a 87 y.o. male with a hx of RBBB. We were asked to see him for pre op evaluation by Dr. Kieth Richmond for pre op clearance  Hx of hyperlipidemia Esophageal cancer - s/p surgery Dec. 2006.   No complications  No CP, no dyspnea, no syncope  Mows his lawn ( push mower)  rakes / blow leaves  No recent surgical procedures   Past Medical History:  Diagnosis Date   Calculus of right kidney    Cancer ([place])    hx of esophageal cancer- 2006    GERD (gastroesophageal reflux disease)    History of esophageal cancer    S/P ESOPHAGECTOMY 2006 (STAGE I)  NO CHEMORADIATION--  NO RECURRENCE   History of kidney stones    Poison oak    recent exposure at time of preop on 11/09/17 - on right arm and on back of legs per patient- almost healed    Right bundle branch block     Past Surgical History:  Procedure Laterality Date   cataract surgery      bilateral    CYSTOSCOPY W/ URETERAL STENT PLACEMENT Right 01/10/2013   Procedure: CYSTOSCOPY WITH RETROGRADE PYELOGRAM/URETERAL STENT PLACEMENT;  Surgeon: Peter Jabs, MD;  Location: [facility];  Service: Urology;  Laterality: Right;   IR URETERAL STENT RIGHT NEW ACCESS W/O SEP NEPHROSTOMY CATH  11/13/2017   LAPAROSCOPIC CHOLECYSTECTOMY  06-24-2005   NEPHROLITHOTOMY Right 11/13/2017   Procedure: NEPHROLITHOTOMY PERCUTANEOUS;  Surgeon: Peter Rhodes, MD;  Location: [facility];  Service: Urology;  Laterality: Right;   TONSILLECTOMY  1943   TRANSHIATAL ESOPHAGECTOMY/ JEJUNOSTOMY/ PYLOROPLASTY  03-31-2005  DR Peter Richmond   ESOPHAGEAL CANCER--  NO RECURRENCE    Current  Medications: Current Meds  Medication Sig   cholecalciferol (VITAMIN D) 1000 units tablet Take 1,000 tablets by mouth daily.    HYDROcodone-acetaminophen (NORCO) 10-325 MG tablet Take 1-2 tablets by mouth every 4 (four) hours as needed for moderate pain. Maximum dose per 24 hours - 8 pills   loperamide (IMODIUM) 2 MG capsule Take by mouth as needed for diarrhea or loose stools. 3 x weekly   metFORMIN (GLUCOPHAGE-XR) 500 MG 24 hr tablet Take 1 tablet by mouth daily.   Omega-3 Fatty Acids (FISH OIL) 1200 MG CAPS Take 1,200 mg by mouth daily.    omeprazole (PRILOSEC) 20 MG capsule Take 20 mg by mouth every evening.    rosuvastatin (CRESTOR) 5 MG tablet Take 5 mg by mouth daily.   tamsulosin (FLOMAX) 0.4 MG CAPS capsule Take 1 capsule (0.4 mg total) by mouth daily after supper.   vitamin B-12 (CYANOCOBALAMIN) 1000 MCG tablet Take 1,000 mcg by mouth daily.     Allergies:   Other and Prevacid [lansoprazole]   Social History   Socioeconomic History   Marital status: Widowed    Spouse name: Not on file   Number of children: 2   Years of education: Not on file   Highest education level: Bachelor's degree (e.g., BA, AB, BS)  Occupational History    Comment: retired  Tobacco Use   Smoking status: Former    Years: 20.00    Types: Cigarettes    Quit date: 01/01/1984    Years since quitting: 37.2   Smokeless tobacco: Never   Tobacco comments:    was an [place] OFF   Vaping Use   Vaping Use: Never used  Substance and Sexual Activity   Alcohol use: Never   Drug use: Never   Sexual activity: Not on file  Other Topics Concern   Not on file  Social History Narrative   Lives alone   Social Determinants of Health   Financial Resource Strain: Not on file  Food Insecurity: Not on file  Transportation Needs: Not on file  Physical Activity: Not on file  Stress: Not on file  Social Connections: Not on file     Family History: The patient's family history includes Pancreatic  cancer in his father; Parkinson's disease in his sister.  ROS:   Please see the history of present illness.     All other systems reviewed and are negative.  EKGs/Labs/Other Studies Reviewed:    The following studies were reviewed today:   EKG: March 19, 2021: Normal sinus rhythm at 77.  He has a right bundle branch block.  No ST or T wave changes.  Recent Labs: No results found for requested labs within last 8760 hours.  Recent Lipid Panel No results found for: CHOL, TRIG, HDL, CHOLHDL, VLDL, LDLCALC, LDLDIRECT   Risk Assessment/Calculations:           Physical Exam:    VS:  BP 128/70 (BP Location: Left Arm, Patient Position: Sitting, Cuff Size: Normal)   Pulse 77   Ht 6\' 3"  (1.905 m)   Wt 184 lb 6.4 oz (83.6 kg)   SpO2 97%   BMI 23.05 kg/m     Wt Readings from Last 3 Encounters:  03/19/21 184 lb 6.4 oz (83.6 kg)  12/16/19 190 lb 8 oz (86.4 kg)  11/13/17 193 lb (87.5 kg)     GEN:  Well nourished, well developed in no acute distress.  Very healthy appearing 87 yo man  HEENT: Normal NECK: No JVD; No carotid bruits LYMPHATICS: No lymphadenopathy CARDIAC: RRR, no murmurs, rubs, gallops RESPIRATORY:  Clear to auscultation without rales, wheezing or rhonchi  ABDOMEN: Soft, non-tender, non-distended MUSCULOSKELETAL:  No edema; No deformity  SKIN: Warm and dry NEUROLOGIC:  Alert and oriented x 3 PSYCHIATRIC:  Normal affect   ASSESSMENT:    1. Pre-operative cardiovascular examination    PLAN:    In order of problems listed above:  Preoperative evaluation: Peter Richmond  presents for preop evaluation prior to hernia repair.  He does not have any cardiac history.  He does have a right bundle branch block on EKG.  This is fairly benign.  He denies any chest pain, syncope, shortness of breath.  He is very active.  He mows his lawn with a push mower on a regular basis.  He rakes leaves or blows leaves regularly.  He is at low risk for CV complications for his hernia repair .     Medication Adjustments/Labs and Tests Ordered: Current medicines are reviewed at length with the patient today.  Concerns regarding medicines are outlined above.  Orders Placed This Encounter  Procedures   EKG 12-Lead   No orders of the defined types were placed in this encounter.   Patient Instructions  Medication Instructions:  Your physician recommends that you continue on your current medications as  directed. Please refer to the Current Medication list given to you today.  *If you need a refill on your cardiac medications before your next appointment, please call your pharmacy*   Lab Work: None If you have labs (blood work) drawn today and your tests are completely normal, you will receive your results only by: [place] (if you have MyChart) OR A paper copy in the mail If you have any lab test that is abnormal or we need to change your treatment, we will call you to review the results.   Testing/Procedures: None   Follow-Up: At [facility], you and your health needs are our priority.  As part of our continuing mission to provide you with exceptional heart care, we have created designated Provider Care Teams.  These Care Teams include your primary Cardiologist (physician) and Advanced Practice Providers (APPs -  Physician Assistants and Nurse Practitioners) who all work together to provide you with the care you need, when you need it.  We recommend signing up for the patient portal called "MyChart".  Sign up information is provided on this After Visit Summary.  MyChart is used to connect with patients for Virtual Visits (Telemedicine).  Patients are able to view lab/test results, encounter notes, upcoming appointments, etc.  Non-urgent messages can be sent to your provider as well.   To learn more about what you can do with MyChart, go to [email or web address].    Your next appointment:   As needed  The format for your next appointment:   In  Person  Provider:   Mertie Moores, MD    Other Instructions     Signed, Mertie Moores, MD  03/19/2021 12:15 PM    [place]

## 2021-03-19 ENCOUNTER — Ambulatory Visit: Payer: Medicare HMO | Admitting: Cardiovascular Disease

## 2021-03-19 ENCOUNTER — Other Ambulatory Visit: Payer: Self-pay

## 2021-03-19 ENCOUNTER — Encounter: Payer: Self-pay | Admitting: Cardiovascular Disease

## 2021-03-19 VITALS — BP 128/70 | HR 77 | Ht 75.0 in | Wt 184.4 lb

## 2021-03-19 DIAGNOSIS — Z01810 Encounter for preprocedural cardiovascular examination: Secondary | ICD-10-CM | POA: Diagnosis not present

## 2021-03-19 NOTE — Telephone Encounter (Signed)
    Patient Name: Peter Richmond  DOB: 06-17-1931 MRN: [id number]  Primary Cardiologist:  Dr. Acie Fredrickson  Chart reviewed as part of pre-operative protocol coverage. Patient was seen by Dr. Acie Fredrickson today for pre-op evaluation for upcoming hernia repair surgery. Per Dr. Elmarie Shiley note: "He does not have any cardiac history. He does have a right bundle branch block on EKG. This is fairly benign. He denies any chest pain, syncope, shortness of breath. He is very active. He mows his lawn with a push mower on a regular basis. He rakes leaves or blows leaves regularly. He is at low risk for CV complications for his hernia repair."  I will route this recommendation to the requesting party via [facility] fax function and remove from pre-op pool.  Please call with questions.  Darreld Mclean, PA-C 03/19/2021, 6:53 PM

## 2021-03-19 NOTE — Patient Instructions (Signed)
Medication Instructions:  Your physician recommends that you continue on your current medications as directed. Please refer to the Current Medication list given to you today.  *If you need a refill on your cardiac medications before your next appointment, please call your pharmacy*   Lab Work: None If you have labs (blood work) drawn today and your tests are completely normal, you will receive your results only by: [place] (if you have MyChart) OR A paper copy in the mail If you have any lab test that is abnormal or we need to change your treatment, we will call you to review the results.   Testing/Procedures: None   Follow-Up: At [facility], you and your health needs are our priority.  As part of our continuing mission to provide you with exceptional heart care, we have created designated Provider Care Teams.  These Care Teams include your primary Cardiologist (physician) and Advanced Practice Providers (APPs -  Physician Assistants and Nurse Practitioners) who all work together to provide you with the care you need, when you need it.  We recommend signing up for the patient portal called "MyChart".  Sign up information is provided on this After Visit Summary.  MyChart is used to connect with patients for Virtual Visits (Telemedicine).  Patients are able to view lab/test results, encounter notes, upcoming appointments, etc.  Non-urgent messages can be sent to your provider as well.   To learn more about what you can do with MyChart, go to [email or web address].    Your next appointment:   As needed  The format for your next appointment:   In Person  Provider:   Mertie Moores, MD    Other Instructions

## 2021-04-11 ENCOUNTER — Ambulatory Visit: Payer: Self-pay | Admitting: General Surgery

## 2021-04-16 DIAGNOSIS — H18513 Endothelial corneal dystrophy, bilateral: Secondary | ICD-10-CM | POA: Diagnosis not present

## 2021-04-16 DIAGNOSIS — Z961 Presence of intraocular lens: Secondary | ICD-10-CM | POA: Diagnosis not present

## 2021-04-16 DIAGNOSIS — H26493 Other secondary cataract, bilateral: Secondary | ICD-10-CM | POA: Diagnosis not present

## 2021-04-26 DIAGNOSIS — R490 Dysphonia: Secondary | ICD-10-CM | POA: Diagnosis not present

## 2021-04-26 DIAGNOSIS — K219 Gastro-esophageal reflux disease without esophagitis: Secondary | ICD-10-CM | POA: Diagnosis not present

## 2021-05-01 NOTE — Patient Instructions (Addendum)
DUE TO COVID-19 ONLY ONE VISITOR IS ALLOWED TO COME WITH YOU AND STAY IN THE WAITING ROOM ONLY DURING PRE OP AND PROCEDURE.   **NO VISITORS ARE ALLOWED IN THE SHORT STAY AREA OR RECOVERY ROOM!!**       Your procedure is scheduled on: 05/07/21   Report to [facility] Main Entrance    Report to admitting at 5:15 AM   Call this number if you have problems the morning of surgery [phone]   Do not eat food :After Midnight.   May have liquids until 4:30 AM day of surgery  CLEAR LIQUID DIET  Foods Allowed                                                                     Foods Excluded  Water, Black Coffee and tea (no milk or creamer)          liquids that you cannot  Plain Jell-O in any flavor  (No red)                                    see through such as: Fruit ices (not with fruit pulp)                                            milk, soups, orange juice              Iced Popsicles (No red)                                               All solid food                                   Apple juices Sports drinks like Gatorade (No red) Lightly seasoned clear broth or consume(fat free) Sugar     The day of surgery:  Drink ONE (1) Pre-Surgery G2 by 4:30 am the morning of surgery. Drink in one sitting. Do not sip.  This drink was given to you during your hospital  pre-op appointment visit. Nothing else to drink after completing the  Pre-Surgery G2.          If you have questions, please contact your surgeons office.     Oral Hygiene is also important to reduce your risk of infection.                                    Remember - BRUSH YOUR TEETH THE MORNING OF SURGERY WITH YOUR REGULAR TOOTHPASTE   Stop all vitamins and supplements 7 days before surgery   Take these medicines the morning of surgery with A SIP OF WATER: Omeprazole, Crestor  DO NOT TAKE ANY ORAL DIABETIC MEDICATIONS DAY OF YOUR SURGERY  How to Manage Your Diabetes Before and After Surgery  Why  is it important to control my blood sugar before and after surgery? Improving blood sugar levels before and after surgery helps healing and can limit problems. A way of improving blood sugar control is eating a healthy diet by:  Eating less sugar and carbohydrates  Increasing activity/exercise  Talking with your doctor about reaching your blood sugar goals High blood sugars (greater than 180 mg/dL) can raise your risk of infections and slow your recovery, so you will need to focus on controlling your diabetes during the weeks before surgery. Make sure that the doctor who takes care of your diabetes knows about your planned surgery including the date and location.  How do I manage my blood sugar before surgery? Check your blood sugar at least 4 times a day, starting 2 days before surgery, to make sure that the level is not too high or low. Check your blood sugar the morning of your surgery when you wake up and every 2 hours until you get to the Short Stay unit. If your blood sugar is less than 70 mg/dL, you will need to treat for low blood sugar: Do not take insulin. Treat a low blood sugar (less than 70 mg/dL) with  cup of clear juice (cranberry or apple), 4 glucose tablets, OR glucose gel. Recheck blood sugar in 15 minutes after treatment (to make sure it is greater than 70 mg/dL). If your blood sugar is not greater than 70 mg/dL on recheck, call [phone] for further instructions. Report your blood sugar to the short stay nurse when you get to Short Stay.  If you are admitted to the hospital after surgery: Your blood sugar will be checked by the staff and you will probably be given insulin after surgery (instead of oral diabetes medicines) to make sure you have good blood sugar levels. The goal for blood sugar control after surgery is 80-180 mg/dL.   WHAT DO I DO ABOUT MY DIABETES MEDICATION?  Do not take oral diabetes medicines (pills) the morning of surgery.  THE DAY BEFORE  SURGERY, take Metformin as prescribed       THE MORNING OF SURGERY, do not take Metformin  Reviewed and Endorsed by [facility] Patient Education Committee, August 2015                               You may not have any metal on your body including jewelry, and body piercing             Do not wear lotions, powders, cologne, or deodorant              Men may shave face and neck.   Do not bring valuables to the hospital. [place].   Contacts, dentures or bridgework may not be worn into surgery.    Patients discharged on the day of surgery will not be allowed to drive home.  We recommend you have a responsible adult to stay with you for the first 24 hours after anesthesia.              Please read over the following fact sheets you were given: IF YOU HAVE QUESTIONS ABOUT YOUR PRE-OP INSTRUCTIONS PLEASE CALL [facility] - Preparing for Surgery Before surgery, you can play an important role.  Because skin is  not sterile, your skin needs to be as free of germs as possible.  You can reduce the number of germs on your skin by washing with CHG (chlorahexidine gluconate) soap before surgery.  CHG is an antiseptic cleaner which kills germs and bonds with the skin to continue killing germs even after washing. Please DO NOT use if you have an allergy to CHG or antibacterial soaps.  If your skin becomes reddened/irritated stop using the CHG and inform your nurse when you arrive at Short Stay. Do not shave (including legs and underarms) for at least 48 hours prior to the first CHG shower.  You may shave your face/neck.  Please follow these instructions carefully:  1.  Shower with CHG Soap the night before surgery and the  morning of surgery.  2.  If you choose to wash your hair, wash your hair first as usual with your normal  shampoo.  3.  After you shampoo, rinse your hair and body thoroughly to remove the shampoo.                              4.  Use CHG as you would any other liquid soap.  You can apply chg directly to the skin and wash.  Gently with a scrungie or clean washcloth.  5.  Apply the CHG Soap to your body ONLY FROM THE NECK DOWN.   Do   not use on face/ open                           Wound or open sores. Avoid contact with eyes, ears mouth and   genitals (private parts).                       Wash face,  Genitals (private parts) with your normal soap.             6.  Wash thoroughly, paying special attention to the area where your    surgery  will be performed.  7.  Thoroughly rinse your body with warm water from the neck down.  8.  DO NOT shower/wash with your normal soap after using and rinsing off the CHG Soap.                9.  Pat yourself dry with a clean towel.            10.  Wear clean pajamas.            11.  Place clean sheets on your bed the night of your first shower and do not  sleep with pets. Day of Surgery : Do not apply any lotions/deodorants the morning of surgery.  Please wear clean clothes to the hospital/surgery center.  FAILURE TO FOLLOW THESE INSTRUCTIONS MAY RESULT IN THE CANCELLATION OF YOUR SURGERY  PATIENT SIGNATURE_________________________________  NURSE SIGNATURE__________________________________  ________________________________________________________________________

## 2021-05-01 NOTE — Progress Notes (Addendum)
COVID swab appointment: n/a  COVID Vaccine Completed: yes x4 Date COVID Vaccine completed: Has received booster: COVID vaccine manufacturer: [place]   Date of COVID positive in last 90 days: no  PCP - Deland Pretty, MD Cardiologist - Cleatrice Burke, MD  Cardiac clearance by Cleatrice Burke 03/19/21 in Epic   Chest x-ray - n/a EKG - 03/19/21 Epic Stress Test - many years ago per pt ECHO - many years ago per pt Cardiac Cath - n/a Pacemaker/ICD device last checked: n/a Spinal Cord Stimulator: n/a  Sleep Study - n/a CPAP -   Fasting Blood Sugar - pre DM, no checks at home Checks Blood Sugar _____ times a day  Blood Thinner Instructions: n/a Aspirin Instructions: Last Dose:  Activity level: Can go up a flight of stairs and perform activities of daily living without stopping and without symptoms of chest pain or shortness of breath.      Anesthesia review:   Patient denies shortness of breath, fever, cough and chest pain at PAT appointment   Patient verbalized understanding of instructions that were given to them at the PAT appointment. Patient was also instructed that they will need to review over the PAT instructions again at home before surgery.

## 2021-05-02 ENCOUNTER — Other Ambulatory Visit: Payer: Self-pay

## 2021-05-02 ENCOUNTER — Encounter (HOSPITAL_COMMUNITY)
Admission: RE | Admit: 2021-05-02 | Discharge: 2021-05-02 | Disposition: A | Discharge status: Home / Self Care | Payer: Medicare HMO | Source: Ambulatory Visit | Attending: General Surgery | Admitting: General Surgery

## 2021-05-02 ENCOUNTER — Encounter (HOSPITAL_COMMUNITY): Payer: Self-pay

## 2021-05-02 VITALS — BP 137/85 | HR 66 | Temp 97.9°F | Resp 18 | Ht 75.0 in | Wt 186.6 lb

## 2021-05-02 DIAGNOSIS — R7303 Prediabetes: Secondary | ICD-10-CM | POA: Diagnosis not present

## 2021-05-02 DIAGNOSIS — Z01812 Encounter for preprocedural laboratory examination: Secondary | ICD-10-CM | POA: Insufficient documentation

## 2021-05-02 DIAGNOSIS — Z01818 Encounter for other preprocedural examination: Secondary | ICD-10-CM

## 2021-05-02 HISTORY — DX: Prediabetes: R73.03

## 2021-05-02 LAB — GLUCOSE, CAPILLARY: Glucose-Capillary: 120 mg/dL — ABNORMAL HIGH (ref 70–99)

## 2021-05-02 LAB — CBC
HCT: 43.6 % (ref 39.0–52.0)
Hemoglobin: 14 g/dL (ref 13.0–17.0)
MCH: 30.6 pg (ref 26.0–34.0)
MCHC: 32.1 g/dL (ref 30.0–36.0)
MCV: 95.4 fL (ref 80.0–100.0)
Platelets: 182 10*3/uL (ref 150–400)
RBC: 4.57 MIL/uL (ref 4.22–5.81)
RDW: 13.2 % (ref 11.5–15.5)
WBC: 7.8 10*3/uL (ref 4.0–10.5)
nRBC: 0 % (ref 0.0–0.2)

## 2021-05-02 LAB — HEMOGLOBIN A1C
Hgb A1c MFr Bld: 6.2 % — ABNORMAL HIGH (ref 4.8–5.6)
Mean Plasma Glucose: 131.24 mg/dL

## 2021-05-06 NOTE — Anesthesia Preprocedure Evaluation (Addendum)
Anesthesia Evaluation  Patient identified by MRN, date of birth, ID band Patient awake    Reviewed: Allergy & Precautions, NPO status , Patient's Chart, lab work & pertinent test results  Airway Mallampati: I  TM Distance: >3 FB Neck ROM: Full    Dental  (+) Dental Advisory Given, Missing,    Pulmonary neg pulmonary ROS, former smoker,    Pulmonary exam normal breath sounds clear to auscultation       Cardiovascular Normal cardiovascular exam Rhythm:Regular Rate:Normal  EKG RBBB   Neuro/Psych negative neurological ROS  negative psych ROS   GI/Hepatic Neg liver ROS, GERD  Controlled and Medicated,H/o esophageal CA s/p esophagectomy 2006   Endo/Other    Renal/GU negative Renal ROS  negative genitourinary   Musculoskeletal negative musculoskeletal ROS (+)   Abdominal   Peds  Hematology negative hematology ROS (+)   Anesthesia Other Findings   Reproductive/Obstetrics                            Anesthesia Physical Anesthesia Plan  ASA: 2  Anesthesia Plan: General and Regional   Post-op Pain Management: Regional block and Tylenol PO (pre-op)   Induction: Intravenous  PONV Risk Score and Plan: 2 and Dexamethasone, Ondansetron and Treatment may vary due to age or medical condition  Airway Management Planned: Oral ETT  Additional Equipment:   Intra-op Plan:   Post-operative Plan: Extubation in OR  Informed Consent: I have reviewed the patients History and Physical, chart, labs and discussed the procedure including the risks, benefits and alternatives for the proposed anesthesia with the patient or authorized representative who has indicated his/her understanding and acceptance.   Patient has DNR.  Discussed DNR with patient and Suspend DNR.   Dental advisory given  Plan Discussed with: CRNA  Anesthesia Plan Comments:       Anesthesia Quick Evaluation

## 2021-05-07 ENCOUNTER — Ambulatory Visit (HOSPITAL_COMMUNITY): Payer: Medicare HMO | Admitting: Anesthesiology

## 2021-05-07 ENCOUNTER — Ambulatory Visit (HOSPITAL_COMMUNITY)
Admission: RE | Admit: 2021-05-07 | Discharge: 2021-05-07 | Disposition: A | Discharge status: Home / Self Care | Payer: Medicare HMO | Attending: General Surgery | Admitting: General Surgery

## 2021-05-07 ENCOUNTER — Encounter (HOSPITAL_COMMUNITY)
Admission: RE | Disposition: A | Discharge status: Home / Self Care | Payer: Self-pay | Source: Home / Self Care | Attending: General Surgery

## 2021-05-07 ENCOUNTER — Other Ambulatory Visit: Payer: Self-pay

## 2021-05-07 ENCOUNTER — Encounter (HOSPITAL_COMMUNITY): Payer: Self-pay | Admitting: General Surgery

## 2021-05-07 DIAGNOSIS — Z8501 Personal history of malignant neoplasm of esophagus: Secondary | ICD-10-CM | POA: Diagnosis not present

## 2021-05-07 DIAGNOSIS — Z9049 Acquired absence of other specified parts of digestive tract: Secondary | ICD-10-CM | POA: Diagnosis not present

## 2021-05-07 DIAGNOSIS — Z79899 Other long term (current) drug therapy: Secondary | ICD-10-CM | POA: Diagnosis not present

## 2021-05-07 DIAGNOSIS — G8918 Other acute postprocedural pain: Secondary | ICD-10-CM | POA: Diagnosis not present

## 2021-05-07 DIAGNOSIS — Z87891 Personal history of nicotine dependence: Secondary | ICD-10-CM | POA: Diagnosis not present

## 2021-05-07 DIAGNOSIS — K219 Gastro-esophageal reflux disease without esophagitis: Secondary | ICD-10-CM | POA: Insufficient documentation

## 2021-05-07 DIAGNOSIS — K4090 Unilateral inguinal hernia, without obstruction or gangrene, not specified as recurrent: Secondary | ICD-10-CM | POA: Diagnosis not present

## 2021-05-07 HISTORY — PX: INGUINAL HERNIA REPAIR: SHX194

## 2021-05-07 LAB — GLUCOSE, CAPILLARY
Glucose-Capillary: 119 mg/dL — ABNORMAL HIGH (ref 70–99)
Glucose-Capillary: 118 mg/dL — ABNORMAL HIGH (ref 70–99)

## 2021-05-07 SURGERY — REPAIR, HERNIA, INGUINAL, ADULT
Anesthesia: Regional | Site: Groin | Laterality: Left

## 2021-05-07 MED ORDER — LIDOCAINE HCL (CARDIAC) PF 100 MG/5ML IV SOSY
PREFILLED_SYRINGE | INTRAVENOUS | Status: DC | PRN
  Administered 2021-05-07: 20 mg via INTRAVENOUS

## 2021-05-07 MED ORDER — CHLORHEXIDINE GLUCONATE 0.12 % MT SOLN
15.0000 mL | Freq: Once | OROMUCOSAL | Status: AC
  Administered 2021-05-07: 15 mL via OROMUCOSAL

## 2021-05-07 MED ORDER — ONDANSETRON HCL 4 MG/2ML IJ SOLN
INTRAMUSCULAR | Status: DC | PRN
  Administered 2021-05-07: 4 mg via INTRAVENOUS

## 2021-05-07 MED ORDER — ACETAMINOPHEN 500 MG PO TABS
1000.0000 mg | ORAL_TABLET | Freq: Once | ORAL | Status: DC
  Filled 2021-05-07: qty 2

## 2021-05-07 MED ORDER — DEXAMETHASONE SODIUM PHOSPHATE 10 MG/ML IJ SOLN
INTRAMUSCULAR | Status: DC | PRN
  Administered 2021-05-07: 5 mg

## 2021-05-07 MED ORDER — ROPIVACAINE HCL 5 MG/ML IJ SOLN
INTRAMUSCULAR | Status: DC | PRN
  Administered 2021-05-07: 30 mL via PERINEURAL

## 2021-05-07 MED ORDER — 0.9 % SODIUM CHLORIDE (POUR BTL) OPTIME
TOPICAL | Status: DC | PRN
  Administered 2021-05-07: 1000 mL

## 2021-05-07 MED ORDER — PHENYLEPHRINE HCL (PRESSORS) 10 MG/ML IV SOLN
INTRAVENOUS | Status: AC
  Filled 2021-05-07: qty 1

## 2021-05-07 MED ORDER — LACTATED RINGERS IV SOLN: INTRAVENOUS | Status: DC

## 2021-05-07 MED ORDER — LIDOCAINE HCL (PF) 2 % IJ SOLN
INTRAMUSCULAR | Status: AC
  Filled 2021-05-07: qty 5

## 2021-05-07 MED ORDER — ORAL CARE MOUTH RINSE: 15.0000 mL | Freq: Once | OROMUCOSAL | Status: AC

## 2021-05-07 MED ORDER — FENTANYL CITRATE (PF) 250 MCG/5ML IJ SOLN
INTRAMUSCULAR | Status: DC | PRN
  Administered 2021-05-07 (×2): 50 ug via INTRAVENOUS

## 2021-05-07 MED ORDER — ACETAMINOPHEN 500 MG PO TABS
1000.0000 mg | ORAL_TABLET | ORAL | Status: AC
  Administered 2021-05-07: 1000 mg via ORAL

## 2021-05-07 MED ORDER — ROCURONIUM BROMIDE 10 MG/ML (PF) SYRINGE
PREFILLED_SYRINGE | INTRAVENOUS | Status: DC | PRN
  Administered 2021-05-07: 70 mg via INTRAVENOUS

## 2021-05-07 MED ORDER — ENSURE PRE-SURGERY PO LIQD
296.0000 mL | Freq: Once | ORAL | Status: DC
  Filled 2021-05-07: qty 296

## 2021-05-07 MED ORDER — FENTANYL CITRATE (PF) 250 MCG/5ML IJ SOLN
INTRAMUSCULAR | Status: AC
  Filled 2021-05-07: qty 5

## 2021-05-07 MED ORDER — PROPOFOL 10 MG/ML IV BOLUS
INTRAVENOUS | Status: DC | PRN
Start: 2021-05-07 — End: 2021-05-07
  Administered 2021-05-07: 80 mg via INTRAVENOUS

## 2021-05-07 MED ORDER — BUPIVACAINE LIPOSOME 1.3 % IJ SUSP
INTRAMUSCULAR | Status: AC
  Filled 2021-05-07: qty 20

## 2021-05-07 MED ORDER — PHENYLEPHRINE 40 MCG/ML (10ML) SYRINGE FOR IV PUSH (FOR BLOOD PRESSURE SUPPORT)
PREFILLED_SYRINGE | INTRAVENOUS | Status: AC
  Filled 2021-05-07: qty 30

## 2021-05-07 MED ORDER — FENTANYL CITRATE PF 50 MCG/ML IJ SOSY: 25.0000 ug | PREFILLED_SYRINGE | INTRAMUSCULAR | Status: DC | PRN

## 2021-05-07 MED ORDER — BUPIVACAINE HCL (PF) 0.5 % IJ SOLN
INTRAMUSCULAR | Status: AC
  Filled 2021-05-07: qty 30

## 2021-05-07 MED ORDER — DEXAMETHASONE SODIUM PHOSPHATE 10 MG/ML IJ SOLN
INTRAMUSCULAR | Status: DC | PRN
  Administered 2021-05-07: 8 mg via INTRAVENOUS

## 2021-05-07 MED ORDER — ROCURONIUM BROMIDE 10 MG/ML (PF) SYRINGE
PREFILLED_SYRINGE | INTRAVENOUS | Status: AC
  Filled 2021-05-07: qty 10

## 2021-05-07 MED ORDER — SUGAMMADEX SODIUM 200 MG/2ML IV SOLN
INTRAVENOUS | Status: DC | PRN
  Administered 2021-05-07: 200 mg via INTRAVENOUS

## 2021-05-07 MED ORDER — PHENYLEPHRINE 40 MCG/ML (10ML) SYRINGE FOR IV PUSH (FOR BLOOD PRESSURE SUPPORT)
PREFILLED_SYRINGE | INTRAVENOUS | Status: DC | PRN
  Administered 2021-05-07 (×2): 120 ug via INTRAVENOUS
  Administered 2021-05-07: 80 ug via INTRAVENOUS
  Administered 2021-05-07: 120 ug via INTRAVENOUS
  Administered 2021-05-07: 160 ug via INTRAVENOUS

## 2021-05-07 MED ORDER — CEFAZOLIN SODIUM-DEXTROSE 2-4 GM/100ML-% IV SOLN
2.0000 g | INTRAVENOUS | Status: AC
  Administered 2021-05-07: 2 g via INTRAVENOUS
  Filled 2021-05-07: qty 100

## 2021-05-07 MED ORDER — CHLORHEXIDINE GLUCONATE CLOTH 2 % EX PADS: 6.0000 | MEDICATED_PAD | Freq: Once | CUTANEOUS | Status: DC

## 2021-05-07 MED ORDER — OXYCODONE HCL 5 MG PO TABS: 5.0000 mg | ORAL_TABLET | Freq: Four times a day (QID) | ORAL | 0 refills | Status: DC | PRN

## 2021-05-07 MED ORDER — PROPOFOL 10 MG/ML IV BOLUS
INTRAVENOUS | Status: AC
  Filled 2021-05-07: qty 20

## 2021-05-07 MED ORDER — IBUPROFEN 800 MG PO TABS: 800.0000 mg | ORAL_TABLET | Freq: Three times a day (TID) | ORAL | 0 refills | Status: DC | PRN

## 2021-05-07 MED ORDER — BUPIVACAINE HCL 0.5 % IJ SOLN
INTRAMUSCULAR | Status: DC | PRN
  Administered 2021-05-07: 10 mL

## 2021-05-07 MED ORDER — PHENYLEPHRINE HCL-NACL 20-0.9 MG/250ML-% IV SOLN
INTRAVENOUS | Status: DC | PRN
  Administered 2021-05-07: 40 ug/min via INTRAVENOUS

## 2021-05-07 SURGICAL SUPPLY — 38 items
BAG COUNTER SPONGE SURGICOUNT (BAG) ×2 IMPLANT
BENZOIN TINCTURE PRP APPL 2/3 (GAUZE/BANDAGES/DRESSINGS) IMPLANT
BLADE SURG 15 STRL LF DISP TIS (BLADE) ×1 IMPLANT
BLADE SURG 15 STRL SS (BLADE) ×2
CHLORAPREP W/TINT 26 (MISCELLANEOUS) ×2 IMPLANT
COVER SURGICAL LIGHT HANDLE (MISCELLANEOUS) ×2 IMPLANT
DECANTER SPIKE VIAL GLASS SM (MISCELLANEOUS) ×2 IMPLANT
DERMABOND ADVANCED (GAUZE/BANDAGES/DRESSINGS) ×1
DERMABOND ADVANCED .7 DNX12 (GAUZE/BANDAGES/DRESSINGS) ×1 IMPLANT
DRAIN PENROSE 0.5X18 (DRAIN) IMPLANT
DRAPE LAPAROTOMY TRNSV 102X78 (DRAPES) ×2 IMPLANT
DRAPE UTILITY 15X26 TOWEL STRL (DRAPES) ×2 IMPLANT
DRSG TELFA PLUS 4X6 ADH ISLAND (GAUZE/BANDAGES/DRESSINGS) IMPLANT
ELECT REM PT RETURN 15FT ADLT (MISCELLANEOUS) ×2 IMPLANT
GAUZE SPONGE 4X4 12PLY STRL (GAUZE/BANDAGES/DRESSINGS) IMPLANT
GLOVE SURG POLYISO LF SZ7 (GLOVE) ×2 IMPLANT
GLOVE SURG UNDER POLY LF SZ7 (GLOVE) ×2 IMPLANT
GOWN STRL REUS W/TWL LRG LVL3 (GOWN DISPOSABLE) ×2 IMPLANT
GOWN STRL REUS W/TWL XL LVL3 (GOWN DISPOSABLE) ×2 IMPLANT
KIT BASIN OR (CUSTOM PROCEDURE TRAY) ×2 IMPLANT
KIT TURNOVER KIT A (KITS) IMPLANT
MARKER SKIN DUAL TIP RULER LAB (MISCELLANEOUS) ×2 IMPLANT
MESH HERNIA 3X6 (Mesh General) ×1 IMPLANT
NEEDLE HYPO 22GX1.5 SAFETY (NEEDLE) ×2 IMPLANT
PACK BASIC VI WITH GOWN DISP (CUSTOM PROCEDURE TRAY) ×2 IMPLANT
PENCIL SMOKE EVACUATOR (MISCELLANEOUS) ×1 IMPLANT
SPONGE T-LAP 4X18 ~~LOC~~+RFID (SPONGE) ×2 IMPLANT
STRIP CLOSURE SKIN 1/2X4 (GAUZE/BANDAGES/DRESSINGS) IMPLANT
SUT MNCRL AB 4-0 PS2 18 (SUTURE) ×2 IMPLANT
SUT PROLENE 2 0 CT2 30 (SUTURE) ×4 IMPLANT
SUT VIC AB 3-0 SH 18 (SUTURE) ×2 IMPLANT
SUT VIC AB 3-0 SH 27 (SUTURE) ×4
SUT VIC AB 3-0 SH 27XBRD (SUTURE) ×2 IMPLANT
SYR BULB IRRIG 60ML STRL (SYRINGE) ×2 IMPLANT
SYR CONTROL 10ML LL (SYRINGE) ×2 IMPLANT
TOWEL OR 17X26 10 PK STRL BLUE (TOWEL DISPOSABLE) ×3 IMPLANT
TOWEL OR NON WOVEN STRL DISP B (DISPOSABLE) ×2 IMPLANT
YANKAUER SUCT BULB TIP 10FT TU (MISCELLANEOUS) ×1 IMPLANT

## 2021-05-07 NOTE — Anesthesia Postprocedure Evaluation (Signed)
Anesthesia Post Note  Patient: Peter Richmond  Procedure(s) Performed: OPEN LEFT INGUINAL HERNIA REPAIR WITH MESH (Left: Groin)     Patient location during evaluation: PACU Anesthesia Type: Regional and General Level of consciousness: awake and alert Pain management: pain level controlled Vital Signs Assessment: post-procedure vital signs reviewed and stable Respiratory status: spontaneous breathing, nonlabored ventilation, respiratory function stable and patient connected to nasal cannula oxygen Cardiovascular status: blood pressure returned to baseline and stable Postop Assessment: no apparent nausea or vomiting Anesthetic complications: no   No notable events documented.  Last Vitals:  Vitals:   05/07/21 0945 05/07/21 1001  BP: 128/67   Pulse: 63 64  Resp: 18 12  Temp: (!) 36.3 C 36.6 C  SpO2: 97% 100%    Last Pain:  Vitals:   05/07/21 1001  TempSrc: Oral  PainSc:                  Kahiau Schewe L Desera Graffeo

## 2021-05-07 NOTE — Anesthesia Procedure Notes (Signed)
Anesthesia Regional Block: Quadratus lumborum   Pre-Anesthetic Checklist: , timeout performed,  Correct Patient, Correct Site, Correct Laterality,  Correct Procedure, Correct Position, site marked,  Risks and benefits discussed,  Surgical consent,  Pre-op evaluation,  At surgeon's request and post-op pain management  Laterality: Left  Prep: Maximum Sterile Barrier Precautions used, chloraprep       Needles:  Injection technique: Single-shot  Needle Type: Echogenic Stimulator Needle     Needle Length: 9cm  Needle Gauge: 22     Additional Needles:   Procedures:,,,, ultrasound used (permanent image in chart),,    Narrative:  Start time: 05/07/2021 7:15 AM End time: 05/07/2021 7:20 AM Injection made incrementally with aspirations every 5 mL.  Performed by: Personally  Anesthesiologist: Freddrick March, MD  Additional Notes: Monitors applied. No increased pain on injection. No increased resistance to injection. Injection made in 5cc increments. Good needle visualization. Patient tolerated procedure well.

## 2021-05-07 NOTE — H&P (Signed)
Chief Complaint: Hernia   History of Present Illness: Peter Richmond is a 88 y.o. male who is seen today as an office consultation at the request of Dr. Shelia Media for evaluation of Hernia .   He first noticed the hernia 7 years ago. Symptoms are discomfort and bulge in the area. He reduces it several times a day. He denies nausea or vomiting or bowel habit change.  He does not smoke He does not have diabetes He has no history of hernias  Review of Systems: A complete review of systems was obtained from the patient. I have reviewed this information and discussed as appropriate with the patient. See HPI as well for other ROS.  Review of Systems  Constitutional: Negative.  HENT: Negative.  Eyes: Negative.  Respiratory: Negative.  Cardiovascular: Negative.  Gastrointestinal: Negative.  Genitourinary: Negative.  Musculoskeletal: Negative.  Skin: Negative.  Neurological: Negative.  Endo/Heme/Allergies: Negative.  Psychiatric/Behavioral: Negative.    Medical History: Past Medical History:  Diagnosis Date   Esophageal varices (CMS-HCC)   There is no problem list on file for this patient.  Past Surgical History:  Procedure Laterality Date   CHOLECYSTECTOMY   ESOPHAGEAL DILATION 04/02/2005    Allergies  Allergen Reactions   Lansoprazole Rash   Current Outpatient Medications on File Prior to Visit  Medication Sig Dispense Refill   carboxymethylcellulose (REFRESH PLUS) 0.5 % ophthalmic solution INSTILL 1 DROP IN [facility] EYE FOUR TIMES A DAY AS NEEDED   cyanocobalamin (VITAMIN B12) 500 MCG tablet Take 2 tablets (1,000 mcg total) by mouth once daily   cyclobenzaprine (FLEXERIL) 10 MG tablet Take 1 tablet (10 mg total) by mouth 3 (three) times daily as needed   metFORMIN (GLUCOPHAGE-XR) 500 MG XR tablet 1 tablet with evening meal   omeprazole (PRILOSEC) 20 MG DR capsule 1 capsule   rosuvastatin (CRESTOR) 5 MG tablet 1 tablet (5 mg total)   No current facility-administered  medications on file prior to visit.   Family History  Problem Relation Age of Onset   Diabetes Brother   Coronary Artery Disease (Blocked arteries around heart) Brother    Social History   Tobacco Use  Smoking Status Never  Smokeless Tobacco Never    Social History   Socioeconomic History   Marital status: Widowed  Tobacco Use   Smoking status: Never   Smokeless tobacco: Never  Substance and Sexual Activity   Alcohol use: Never   Drug use: Never   Objective:   Vitals:  03/13/21 1044  BP: 138/82  Pulse: 73  Temp: 36.8 C (98.3 F)  SpO2: 99%  Weight: 85.4 kg (188 lb 3.2 oz)  Height: 190.5 cm (6\' 3" )   Body mass index is 23.52 kg/m.  Physical Exam Constitutional:  Appearance: Normal appearance.  HENT:  Head: Normocephalic and atraumatic.  Pulmonary:  Effort: Pulmonary effort is normal.  Abdominal:  Comments: Moderate left inguinal hernia  Musculoskeletal:  General: Normal range of motion.  Cervical back: Normal range of motion.  Neurological:  General: No focal deficit present.  Mental Status: He is alert and oriented to person, place, and time. Mental status is at baseline.  Psychiatric:  Mood and Affect: Mood normal.  Behavior: Behavior normal.  Thought Content: Thought content normal.     Labs, Imaging and Diagnostic Testing: I reviewed notes by Deland Pretty, MD  Assessment and Plan:  Diagnoses and all orders for this visit:  Non-recurrent unilateral inguinal hernia without obstruction or gangrene    We discussed etiology of hernias  and how they can cause pain. We discussed options for inguinal hernia repair vs observation. We discussed details of the surgery of general anesthesia, surgical approach and incisions, dissecting the sack away from vas deference, testicular vessels and nerves and placement of mesh. We discussed risks of bleeding, infection, recurrence, injury to vas deference, testicular vessels, nerve injury, and chronic pain. He  showed good understanding and wanted proceed with open left inguinal hernia repair as outpatient.

## 2021-05-07 NOTE — Transfer of Care (Signed)
Immediate Anesthesia Transfer of Care Note  Patient: Peter Richmond  Procedure(s) Performed: OPEN LEFT INGUINAL HERNIA REPAIR WITH MESH (Left: Groin)  Patient Location: PACU  Anesthesia Type:General  Level of Consciousness: drowsy and patient cooperative  Airway & Oxygen Therapy: Patient Spontanous Breathing and Patient connected to face mask oxygen  Post-op Assessment: Report given to RN and Post -op Vital signs reviewed and stable  Post vital signs: Reviewed and stable  Last Vitals:  Vitals Value Taken Time  BP 152/87 05/07/21 0854  Temp    Pulse 63 05/07/21 0856  Resp 12 05/07/21 0856  SpO2 100 % 05/07/21 0856  Vitals shown include unvalidated device data.  Last Pain:  Vitals:   05/07/21 0549  TempSrc: Oral  PainSc:          Complications: No notable events documented.

## 2021-05-07 NOTE — Anesthesia Procedure Notes (Signed)
Procedure Name: Intubation Date/Time: 05/07/2021 7:34 AM Performed by: Raenette Rover, CRNA Pre-anesthesia Checklist: Patient identified, Emergency Drugs available, Suction available and Patient being monitored Patient Re-evaluated:Patient Re-evaluated prior to induction Oxygen Delivery Method: Circle system utilized Preoxygenation: Pre-oxygenation with 100% oxygen Induction Type: IV induction Ventilation: Mask ventilation without difficulty Laryngoscope Size: Mac and 4 Grade View: Grade I Tube type: Oral Tube size: 7.5 mm Number of attempts: 1 Airway Equipment and Method: Stylet Placement Confirmation: ETT inserted through vocal cords under direct vision, positive ETCO2 and breath sounds checked- equal and bilateral Secured at: 22 cm Tube secured with: Tape Dental Injury: Teeth and Oropharynx as per pre-operative assessment

## 2021-05-07 NOTE — Op Note (Signed)
Preop diagnosis: left inguinal hernia  Postop diagnosis: left inguinal hernia  Procedure: open Left inguinal hernia repair with mesh  Surgeon: Gurney Maxin, M.D.  Asst: Radonna Ricker, M.D.  Anesthesia: Gen.   Indications for procedure: Peter Richmond is a 88 y.o. male with symptoms of pain and enlarging Left inguinal hernia(s). After discussing risks, alternatives and benefits he decided on open repair and was brought to day surgery for repair.  Description of procedure: The patient was brought into the operative suite, placed supine. Anesthesia was administered with endotracheal tube. Patient was strapped in place. The patient was prepped and draped in the usual sterile fashion.  The anterior superior iliac spine and pubic tubercle were identified on the Left side. An incision was made 1cm above the connecting line, representative of the location of the inguinal ligament. The subcutaneous tissue was bluntly dissected, scarpa's fascia was dissected away. The external abdominal oblique fascia was identified and sharply opened down to the external inguinal ring. The conjoint tendon and inguinal ligament were identified. The cord structures and sac were dissected free of the surrounding tissue in 360 degrees. A penrose drain was used to encircle the contents. The cremasteric fibers were dissected free of the contents of the cord and hernia sac. The cord structures (vessels and vas deferens) were identified and carefully dissected away from the hernia sac. The hernia sac was reduced and contained no visceral structures.The hernia sac was dissected down to the internal inguinal ring. Preperitoneal fat was identified showing appropriate dissection. The sac was then reduced into the preperitoneal space. The hernia was indirect. A 3x6 Bard mesh was then used to close the defect and reinforce the floor. The mesh was sutured to the lacunar ligament and inguinal ligament using a 2-0 prolene in running  fashion. Next the superior edge of the mesh was sutured to the conjoined tendon using a 2-0 running Prolene. An additional 2-0 Prolene was used to suture the tail ends of the mesh together re-creating the deep ring. Cord structures are running in a neutral position through the mesh. Next the external abdominal oblique fascia was closed with a 2-0 Vicryl in interrupted fashion to re-create the external inguinal ring. Scarpa's fascia was closed with 3-0 Vicryl in running fashion. Skin was closed with a 4-0 Monocryl subcuticular stitch in running fashion. Dermabond place for dressing. Patient woke from anesthesia and brought to PACU in stable condition. All counts are correct.  Findings: left inguinal hernia  Specimen: none  Blood loss: 20 ml  Local anesthesia: 10 ml Marcaine  Complications: none  Implant: 10 x 15 cm Bard mesh  Gurney Maxin, M.D. General, Bariatric, & Minimally Invasive Surgery [facility], [place] 8:51 AM 05/07/2021

## 2021-05-07 NOTE — Addendum Note (Signed)
Addendum  created 05/07/21 1119 by Freddrick March, MD   Clinical Note Signed

## 2021-05-08 ENCOUNTER — Encounter (HOSPITAL_COMMUNITY): Payer: Self-pay | Admitting: General Surgery

## 2021-07-31 DIAGNOSIS — R7303 Prediabetes: Secondary | ICD-10-CM | POA: Diagnosis not present

## 2021-07-31 DIAGNOSIS — L120 Bullous pemphigoid: Secondary | ICD-10-CM | POA: Diagnosis not present

## 2021-07-31 DIAGNOSIS — N1831 Chronic kidney disease, stage 3a: Secondary | ICD-10-CM | POA: Diagnosis not present

## 2021-08-01 DIAGNOSIS — I700 Atherosclerosis of aorta: Secondary | ICD-10-CM | POA: Diagnosis not present

## 2021-08-01 DIAGNOSIS — E119 Type 2 diabetes mellitus without complications: Secondary | ICD-10-CM | POA: Diagnosis not present

## 2021-08-01 DIAGNOSIS — E7800 Pure hypercholesterolemia, unspecified: Secondary | ICD-10-CM | POA: Diagnosis not present

## 2021-08-07 DIAGNOSIS — E7800 Pure hypercholesterolemia, unspecified: Secondary | ICD-10-CM | POA: Diagnosis not present

## 2021-08-07 DIAGNOSIS — E119 Type 2 diabetes mellitus without complications: Secondary | ICD-10-CM | POA: Diagnosis not present

## 2021-08-07 DIAGNOSIS — N1831 Chronic kidney disease, stage 3a: Secondary | ICD-10-CM | POA: Diagnosis not present

## 2021-10-24 DIAGNOSIS — E7800 Pure hypercholesterolemia, unspecified: Secondary | ICD-10-CM | POA: Diagnosis not present

## 2021-10-24 DIAGNOSIS — I700 Atherosclerosis of aorta: Secondary | ICD-10-CM | POA: Diagnosis not present

## 2021-10-24 DIAGNOSIS — E119 Type 2 diabetes mellitus without complications: Secondary | ICD-10-CM | POA: Diagnosis not present

## 2021-10-31 DIAGNOSIS — N1831 Chronic kidney disease, stage 3a: Secondary | ICD-10-CM | POA: Diagnosis not present

## 2021-10-31 DIAGNOSIS — E1169 Type 2 diabetes mellitus with other specified complication: Secondary | ICD-10-CM | POA: Diagnosis not present

## 2021-10-31 DIAGNOSIS — E7800 Pure hypercholesterolemia, unspecified: Secondary | ICD-10-CM | POA: Diagnosis not present

## 2021-11-12 DIAGNOSIS — N401 Enlarged prostate with lower urinary tract symptoms: Secondary | ICD-10-CM | POA: Diagnosis not present

## 2021-11-12 DIAGNOSIS — R42 Dizziness and giddiness: Secondary | ICD-10-CM | POA: Diagnosis not present

## 2021-11-12 DIAGNOSIS — R2689 Other abnormalities of gait and mobility: Secondary | ICD-10-CM | POA: Diagnosis not present

## 2021-11-12 DIAGNOSIS — Z8501 Personal history of malignant neoplasm of esophagus: Secondary | ICD-10-CM | POA: Diagnosis not present

## 2021-11-12 DIAGNOSIS — R351 Nocturia: Secondary | ICD-10-CM | POA: Diagnosis not present

## 2021-11-12 DIAGNOSIS — I700 Atherosclerosis of aorta: Secondary | ICD-10-CM | POA: Diagnosis not present

## 2021-11-12 DIAGNOSIS — E1169 Type 2 diabetes mellitus with other specified complication: Secondary | ICD-10-CM | POA: Diagnosis not present

## 2021-12-16 DIAGNOSIS — N401 Enlarged prostate with lower urinary tract symptoms: Secondary | ICD-10-CM | POA: Diagnosis not present

## 2021-12-16 DIAGNOSIS — R351 Nocturia: Secondary | ICD-10-CM | POA: Diagnosis not present

## 2021-12-16 DIAGNOSIS — R2681 Unsteadiness on feet: Secondary | ICD-10-CM | POA: Diagnosis not present

## 2021-12-16 DIAGNOSIS — W19XXXA Unspecified fall, initial encounter: Secondary | ICD-10-CM | POA: Diagnosis not present

## 2021-12-17 ENCOUNTER — Other Ambulatory Visit: Payer: Self-pay | Admitting: Internal Medicine

## 2021-12-17 DIAGNOSIS — R2681 Unsteadiness on feet: Secondary | ICD-10-CM

## 2022-01-03 DIAGNOSIS — R2681 Unsteadiness on feet: Secondary | ICD-10-CM | POA: Diagnosis not present

## 2022-01-08 DIAGNOSIS — R2681 Unsteadiness on feet: Secondary | ICD-10-CM | POA: Diagnosis not present

## 2022-01-10 DIAGNOSIS — R2681 Unsteadiness on feet: Secondary | ICD-10-CM | POA: Diagnosis not present

## 2022-01-15 DIAGNOSIS — R2681 Unsteadiness on feet: Secondary | ICD-10-CM | POA: Diagnosis not present

## 2022-01-16 DIAGNOSIS — R2681 Unsteadiness on feet: Secondary | ICD-10-CM | POA: Diagnosis not present

## 2022-01-16 DIAGNOSIS — N401 Enlarged prostate with lower urinary tract symptoms: Secondary | ICD-10-CM | POA: Diagnosis not present

## 2022-01-17 DIAGNOSIS — R2681 Unsteadiness on feet: Secondary | ICD-10-CM | POA: Diagnosis not present

## 2022-01-20 ENCOUNTER — Ambulatory Visit: Payer: Medicare HMO | Admitting: Neurology

## 2022-01-20 ENCOUNTER — Other Ambulatory Visit: Payer: Medicare HMO

## 2022-01-20 DIAGNOSIS — R2681 Unsteadiness on feet: Secondary | ICD-10-CM | POA: Diagnosis not present

## 2022-01-22 ENCOUNTER — Ambulatory Visit: Payer: Medicare HMO | Admitting: Neurology

## 2022-01-23 DIAGNOSIS — R2681 Unsteadiness on feet: Secondary | ICD-10-CM | POA: Diagnosis not present

## 2022-01-27 DIAGNOSIS — R2681 Unsteadiness on feet: Secondary | ICD-10-CM | POA: Diagnosis not present

## 2022-01-28 DIAGNOSIS — R2681 Unsteadiness on feet: Secondary | ICD-10-CM | POA: Diagnosis not present

## 2022-01-28 DIAGNOSIS — Z23 Encounter for immunization: Secondary | ICD-10-CM | POA: Diagnosis not present

## 2022-01-30 DIAGNOSIS — I700 Atherosclerosis of aorta: Secondary | ICD-10-CM | POA: Diagnosis not present

## 2022-01-30 DIAGNOSIS — K219 Gastro-esophageal reflux disease without esophagitis: Secondary | ICD-10-CM | POA: Diagnosis not present

## 2022-01-30 DIAGNOSIS — R7303 Prediabetes: Secondary | ICD-10-CM | POA: Diagnosis not present

## 2022-01-30 DIAGNOSIS — R2681 Unsteadiness on feet: Secondary | ICD-10-CM | POA: Diagnosis not present

## 2022-01-31 ENCOUNTER — Ambulatory Visit
Admission: RE | Admit: 2022-01-31 | Discharge: 2022-01-31 | Disposition: A | Discharge status: Home / Self Care | Payer: Medicare HMO | Source: Ambulatory Visit | Attending: Internal Medicine | Admitting: Internal Medicine

## 2022-01-31 DIAGNOSIS — I639 Cerebral infarction, unspecified: Secondary | ICD-10-CM | POA: Diagnosis not present

## 2022-01-31 DIAGNOSIS — R2681 Unsteadiness on feet: Secondary | ICD-10-CM

## 2022-02-04 DIAGNOSIS — Z0000 Encounter for general adult medical examination without abnormal findings: Secondary | ICD-10-CM | POA: Diagnosis not present

## 2022-02-04 DIAGNOSIS — K219 Gastro-esophageal reflux disease without esophagitis: Secondary | ICD-10-CM | POA: Diagnosis not present

## 2022-02-04 DIAGNOSIS — N401 Enlarged prostate with lower urinary tract symptoms: Secondary | ICD-10-CM | POA: Diagnosis not present

## 2022-02-04 DIAGNOSIS — Z8673 Personal history of transient ischemic attack (TIA), and cerebral infarction without residual deficits: Secondary | ICD-10-CM | POA: Diagnosis not present

## 2022-02-04 DIAGNOSIS — Z23 Encounter for immunization: Secondary | ICD-10-CM | POA: Diagnosis not present

## 2022-02-04 DIAGNOSIS — E1169 Type 2 diabetes mellitus with other specified complication: Secondary | ICD-10-CM | POA: Diagnosis not present

## 2022-02-04 DIAGNOSIS — N1831 Chronic kidney disease, stage 3a: Secondary | ICD-10-CM | POA: Diagnosis not present

## 2022-02-04 DIAGNOSIS — L120 Bullous pemphigoid: Secondary | ICD-10-CM | POA: Diagnosis not present

## 2022-02-04 DIAGNOSIS — I4510 Unspecified right bundle-branch block: Secondary | ICD-10-CM | POA: Diagnosis not present

## 2022-02-04 DIAGNOSIS — R2689 Other abnormalities of gait and mobility: Secondary | ICD-10-CM | POA: Diagnosis not present

## 2022-02-05 DIAGNOSIS — R2681 Unsteadiness on feet: Secondary | ICD-10-CM | POA: Diagnosis not present

## 2022-02-06 ENCOUNTER — Other Ambulatory Visit: Payer: Medicare HMO

## 2022-02-13 ENCOUNTER — Ambulatory Visit: Payer: Medicare HMO | Admitting: Neurology

## 2022-02-13 ENCOUNTER — Telehealth: Payer: Self-pay | Admitting: Neurology

## 2022-02-13 ENCOUNTER — Encounter: Payer: Self-pay | Admitting: Neurology

## 2022-02-13 VITALS — BP 151/83 | HR 79 | Ht 75.0 in | Wt 180.0 lb

## 2022-02-13 DIAGNOSIS — W19XXXS Unspecified fall, sequela: Secondary | ICD-10-CM

## 2022-02-13 DIAGNOSIS — I6381 Other cerebral infarction due to occlusion or stenosis of small artery: Secondary | ICD-10-CM | POA: Diagnosis not present

## 2022-02-13 DIAGNOSIS — R269 Unspecified abnormalities of gait and mobility: Secondary | ICD-10-CM | POA: Diagnosis not present

## 2022-02-13 DIAGNOSIS — W19XXXA Unspecified fall, initial encounter: Secondary | ICD-10-CM | POA: Insufficient documentation

## 2022-02-13 NOTE — Progress Notes (Signed)
Chief Complaint  Patient presents with   New Patient (Initial Visit)    Rm 15 with son [name]. Pt reports he is here to discuss decrease in balance and stutter in speech. Pt reports sx have been on going now for several months. 4 falls since Jan of 2022.     ASSESSMENT AND PLAN  BIFF RUTIGLIANO is a 88 y.o. male   Gait abnormalities, frequent fall,  Length-dependent sensory loss, areflexia, bilateral Babinski sign,  MRI cervical spine to rule out cervical spondylitic myelopathy Lacunar infarction by MRI of the brain Vascular Risk factor of aging, diabetes, Continue aspirin 81 mg daily Encourage moderate exercise, increase water intake Complete evaluation with ultrasound of carotid artery, echocardiogram  Return to clinic in 3 months  DIAGNOSTIC DATA (LABS, IMAGING, TESTING) - I reviewed patient records, labs, notes, testing and imaging myself where available.   MEDICAL HISTORY:  Peter Richmond, is a 88 year old male, seen in request by his primary care physician Dr.   Shelia Media, Th[place] Jew, for evaluation of unsteady gait, he is accompanied by his son at today's visit February 13, 2022   I reviewed and summarized the referring note. PMHX DM since 2023 Esophageal Cancer in 2006, sugery,  Hx of kidney stone,  Patient remains active but since 2022, he had a few fall episode, could not clearly remember most recent falls.  In September 23, when trying to get up from kneeling down position, he fell, did not loss consciousness  He no longer feel confident walking in his neighborhood, worried about his gait, full to exercise on the treadmill, he has good appetite, sleep well, no bladder and bowel incontinence,  Personally reviewed MRI on Oct 6th 2023,  No acute intracranial abnormality or significant interval change. 2. 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 3. 2 small remote lacunar infarcts of the cerebellum.  PHYSICAL EXAM:   Vitals:   02/13/22 1113  BP: (!) 151/83   Pulse: 79  Weight: 180 lb (81.6 kg)  Height: '6\' 3"'$  (1.905 m)   Sitting down 144/76, 77, standing up  139/78, 85; 159/94, 90,   Body mass index is 22.5 kg/m.  PHYSICAL EXAMNIATION:  Gen: NAD, conversant, well nourised, well groomed                     Cardiovascular: Regular rate rhythm, no peripheral edema, warm, nontender. Eyes: Conjunctivae clear without exudates or hemorrhage Neck: Supple, no carotid bruits. Pulmonary: Clear to auscultation bilaterally   NEUROLOGICAL EXAM:  MENTAL STATUS: Speech/cognition: Awake, alert, oriented to history taking and casual conversation CRANIAL NERVES: CN II: Visual fields are full to confrontation. Pupils are round equal and briskly reactive to light. CN III, IV, VI: extraocular movement are normal. No ptosis. CN V: Facial sensation is intact to light touch CN VII: Face is symmetric with normal eye closure  CN VIII: Hearing is normal to causal conversation. CN IX, X: Phonation is normal. CN XI: Head turning and shoulder shrug are intact  MOTOR: There is no pronator drift of out-stretched arms. Muscle bulk and tone are normal. Muscle strength is normal.  REFLEXES: Reflexes are 3  and symmetric at the biceps, triceps, knees, and absent at ankles. Plantar responses are extensor bilaterally.  SENSORY: Length-dependent decreased pinprick, vibratory sensation to knee level  COORDINATION: There is no trunk or limb dysmetria noted.  GAIT/STANCE: Can get up from seated position but with effort, unsteady, stiff, cautious gait  REVIEW OF SYSTEMS:  Full 14  system review of systems performed and notable only for as above All other review of systems were negative.   ALLERGIES: Allergies  Allergen Reactions   Prevacid [Lansoprazole] Rash    HOME MEDICATIONS: Current Outpatient Medications  Medication Sig Dispense Refill   Aspirin 81 MG CAPS Take by mouth.     carboxymethylcellulose (REFRESH PLUS) 0.5 % SOLN 1 drop 4 (four) times  daily as needed (dry eyes).     cholecalciferol (VITAMIN D) 1000 units tablet Take 1,000 Units by mouth daily.     cyclobenzaprine (FLEXERIL) 10 MG tablet Take 10 mg by mouth 3 (three) times daily as needed for muscle spasms.     ibuprofen (ADVIL) 800 MG tablet Take 1 tablet (800 mg total) by mouth every 8 (eight) hours as needed. 30 tablet 0   metFORMIN (GLUCOPHAGE-XR) 500 MG 24 hr tablet Take 500 mg by mouth daily.     Omega-3 Fatty Acids (FISH OIL) 1000 MG CAPS Take 1,000 mg by mouth daily.     omeprazole (PRILOSEC) 20 MG capsule Take 20 mg by mouth 2 (two) times daily before a meal.     rosuvastatin (CRESTOR) 5 MG tablet Take 5 mg by mouth daily.     vitamin B-12 (CYANOCOBALAMIN) 1000 MCG tablet Take 1,000 mcg by mouth daily.     tamsulosin (FLOMAX) 0.4 MG CAPS capsule Take 1 capsule (0.4 mg total) by mouth daily after supper. (Patient not taking: Reported on 02/13/2022) 10 capsule 0   No current facility-administered medications for this visit.    PAST MEDICAL HISTORY: Past Medical History:  Diagnosis Date   Calculus of right kidney    Cancer ([place])    hx of esophageal cancer- 2006    GERD (gastroesophageal reflux disease)    History of esophageal cancer    S/P ESOPHAGECTOMY 2006 (STAGE I)  NO CHEMORADIATION--  NO RECURRENCE   History of kidney stones    Poison oak    recent exposure at time of preop on 11/09/17 - on right arm and on back of legs per patient- almost healed    Pre-diabetes    Right bundle branch block     PAST SURGICAL HISTORY: Past Surgical History:  Procedure Laterality Date   cataract surgery      bilateral    CYSTOSCOPY W/ URETERAL STENT PLACEMENT Right 01/10/2013   Procedure: CYSTOSCOPY WITH RETROGRADE PYELOGRAM/URETERAL STENT PLACEMENT;  Surgeon: Claybon Jabs, MD;  Location: [facility];  Service: Urology;  Laterality: Right;   INGUINAL HERNIA REPAIR Left 05/07/2021   Procedure: OPEN LEFT INGUINAL HERNIA REPAIR WITH MESH;  Surgeon:  Kinsinger, Arta Bruce, MD;  [facility];  Service: General;  Laterality: Left;   IR URETERAL STENT RIGHT NEW ACCESS W/O SEP NEPHROSTOMY CATH  11/13/2017   LAPAROSCOPIC CHOLECYSTECTOMY  06-24-2005   NEPHROLITHOTOMY Right 11/13/2017   Procedure: NEPHROLITHOTOMY PERCUTANEOUS;  Surgeon: Kathie Rhodes, MD;  [facility];  Service: Urology;  Laterality: Right;   TONSILLECTOMY  1943   TRANSHIATAL ESOPHAGECTOMY/ JEJUNOSTOMY/ PYLOROPLASTY  03-31-2005  DR Arlyce Dice   ESOPHAGEAL CANCER--  NO RECURRENCE    FAMILY HISTORY: Family History  Problem Relation Age of Onset   Pancreatic cancer Father    Parkinson's disease Sister     SOCIAL HISTORY: Social History   Socioeconomic History   Marital status: Widowed    Spouse name: Not on file   Number of children: 2   Years of education: Not on file   Highest education level: Bachelor's degree (  e.g., BA, AB, BS)  Occupational History    Comment: retired  Tobacco Use   Smoking status: Former    Years: 20.00    Types: Cigarettes    Quit date: 01/01/1984    Years since quitting: 38.1   Smokeless tobacco: Never   Tobacco comments:    was an [place] OFF   Vaping Use   Vaping Use: Never used  Substance and Sexual Activity   Alcohol use: Never   Drug use: Never   Sexual activity: Not on file  Other Topics Concern   Not on file  Social History Narrative   Lives alone   Social Determinants of Health   Financial Resource Strain: Not on file  Food Insecurity: Not on file  Transportation Needs: Not on file  Physical Activity: Not on file  Stress: Not on file  Social Connections: Not on file  Intimate Partner Violence: Not on file      Marcial Pacas, M.D. Ph.D.  [facility] [address] [place], [place] [address] Ph: [phone] Fax: [phone]  CC:  Deland Pretty, MD [place],  [place] [address]  Deland Pretty, MD

## 2022-02-13 NOTE — Telephone Encounter (Signed)
[employer] medicare sent to [facility] they obtain auth [id number] 

## 2022-02-14 DIAGNOSIS — R2681 Unsteadiness on feet: Secondary | ICD-10-CM | POA: Diagnosis not present

## 2022-02-17 DIAGNOSIS — R2681 Unsteadiness on feet: Secondary | ICD-10-CM | POA: Diagnosis not present

## 2022-02-20 ENCOUNTER — Ambulatory Visit
Admission: RE | Admit: 2022-02-20 | Discharge: 2022-02-20 | Disposition: A | Discharge status: Home / Self Care | Payer: Medicare HMO | Source: Ambulatory Visit | Attending: Neurology | Admitting: Neurology

## 2022-02-20 DIAGNOSIS — R269 Unspecified abnormalities of gait and mobility: Secondary | ICD-10-CM

## 2022-02-20 DIAGNOSIS — M4802 Spinal stenosis, cervical region: Secondary | ICD-10-CM | POA: Diagnosis not present

## 2022-02-20 DIAGNOSIS — W19XXXS Unspecified fall, sequela: Secondary | ICD-10-CM

## 2022-02-20 DIAGNOSIS — R270 Ataxia, unspecified: Secondary | ICD-10-CM | POA: Diagnosis not present

## 2022-02-21 DIAGNOSIS — R2681 Unsteadiness on feet: Secondary | ICD-10-CM | POA: Diagnosis not present

## 2022-02-28 DIAGNOSIS — R2681 Unsteadiness on feet: Secondary | ICD-10-CM | POA: Diagnosis not present

## 2022-03-03 ENCOUNTER — Ambulatory Visit: Payer: Medicare HMO | Admitting: Neurology

## 2022-03-05 ENCOUNTER — Ambulatory Visit (HOSPITAL_BASED_OUTPATIENT_CLINIC_OR_DEPARTMENT_OTHER)
Admission: RE | Admit: 2022-03-05 | Discharge: 2022-03-05 | Disposition: A | Discharge status: Home / Self Care | Payer: Medicare HMO | Source: Ambulatory Visit | Attending: Neurology | Admitting: Neurology

## 2022-03-05 ENCOUNTER — Ambulatory Visit (HOSPITAL_COMMUNITY)
Admission: RE | Admit: 2022-03-05 | Discharge: 2022-03-05 | Disposition: A | Discharge status: Home / Self Care | Payer: Medicare HMO | Source: Ambulatory Visit | Attending: Neurology | Admitting: Neurology

## 2022-03-05 DIAGNOSIS — I6381 Other cerebral infarction due to occlusion or stenosis of small artery: Secondary | ICD-10-CM | POA: Insufficient documentation

## 2022-03-05 DIAGNOSIS — W19XXXS Unspecified fall, sequela: Secondary | ICD-10-CM

## 2022-03-05 DIAGNOSIS — R269 Unspecified abnormalities of gait and mobility: Secondary | ICD-10-CM

## 2022-03-05 LAB — ECHOCARDIOGRAM COMPLETE
Area-P 1/2: 3.85 cm2
MV VTI: 1.86 cm2
S' Lateral: 2.5 cm

## 2022-03-05 NOTE — Progress Notes (Signed)
Carotid duplex has been completed.   Preliminary results in CV Proc.   Jinny Blossom Wandalene Abrams 03/05/2022 8:59 AM

## 2022-03-05 NOTE — Progress Notes (Signed)
  Echocardiogram 2D Echocardiogram has been performed.  Peter Richmond 03/05/2022, 10:43 AM

## 2022-03-07 DIAGNOSIS — R2681 Unsteadiness on feet: Secondary | ICD-10-CM | POA: Diagnosis not present

## 2022-03-11 DIAGNOSIS — Z23 Encounter for immunization: Secondary | ICD-10-CM | POA: Diagnosis not present

## 2022-03-14 DIAGNOSIS — R2681 Unsteadiness on feet: Secondary | ICD-10-CM | POA: Diagnosis not present

## 2022-03-18 DIAGNOSIS — R2681 Unsteadiness on feet: Secondary | ICD-10-CM | POA: Diagnosis not present

## 2022-03-25 DIAGNOSIS — Z85828 Personal history of other malignant neoplasm of skin: Secondary | ICD-10-CM | POA: Diagnosis not present

## 2022-03-25 DIAGNOSIS — D692 Other nonthrombocytopenic purpura: Secondary | ICD-10-CM | POA: Diagnosis not present

## 2022-03-25 DIAGNOSIS — L821 Other seborrheic keratosis: Secondary | ICD-10-CM | POA: Diagnosis not present

## 2022-03-25 DIAGNOSIS — L918 Other hypertrophic disorders of the skin: Secondary | ICD-10-CM | POA: Diagnosis not present

## 2022-03-25 DIAGNOSIS — D225 Melanocytic nevi of trunk: Secondary | ICD-10-CM | POA: Diagnosis not present

## 2022-03-25 DIAGNOSIS — L570 Actinic keratosis: Secondary | ICD-10-CM | POA: Diagnosis not present

## 2022-03-25 DIAGNOSIS — D2272 Melanocytic nevi of left lower limb, including hip: Secondary | ICD-10-CM | POA: Diagnosis not present

## 2022-03-25 DIAGNOSIS — D1801 Hemangioma of skin and subcutaneous tissue: Secondary | ICD-10-CM | POA: Diagnosis not present

## 2022-03-31 DIAGNOSIS — R2681 Unsteadiness on feet: Secondary | ICD-10-CM | POA: Diagnosis not present

## 2022-04-08 DIAGNOSIS — R2681 Unsteadiness on feet: Secondary | ICD-10-CM | POA: Diagnosis not present

## 2022-04-14 DIAGNOSIS — R2681 Unsteadiness on feet: Secondary | ICD-10-CM | POA: Diagnosis not present

## 2022-04-17 DIAGNOSIS — H26493 Other secondary cataract, bilateral: Secondary | ICD-10-CM | POA: Diagnosis not present

## 2022-04-17 DIAGNOSIS — H18513 Endothelial corneal dystrophy, bilateral: Secondary | ICD-10-CM | POA: Diagnosis not present

## 2022-04-17 DIAGNOSIS — Z961 Presence of intraocular lens: Secondary | ICD-10-CM | POA: Diagnosis not present

## 2022-04-29 DIAGNOSIS — R2681 Unsteadiness on feet: Secondary | ICD-10-CM | POA: Diagnosis not present

## 2022-05-07 DIAGNOSIS — R2681 Unsteadiness on feet: Secondary | ICD-10-CM | POA: Diagnosis not present

## 2022-05-13 DIAGNOSIS — R2681 Unsteadiness on feet: Secondary | ICD-10-CM | POA: Diagnosis not present

## 2022-05-15 ENCOUNTER — Ambulatory Visit: Payer: Medicare HMO | Admitting: Neurology

## 2022-05-15 ENCOUNTER — Encounter: Payer: Self-pay | Admitting: Neurology

## 2022-05-15 VITALS — BP 132/68 | HR 65 | Ht 75.0 in | Wt 191.0 lb

## 2022-05-15 DIAGNOSIS — I6381 Other cerebral infarction due to occlusion or stenosis of small artery: Secondary | ICD-10-CM

## 2022-05-15 DIAGNOSIS — R269 Unspecified abnormalities of gait and mobility: Secondary | ICD-10-CM

## 2022-05-15 NOTE — Progress Notes (Signed)
Chief Complaint  Patient presents with   Follow-up    Rm 15. Accompanied by son, [name]. No new concerns.    ASSESSMENT AND PLAN  Peter Richmond is a 89 y.o. male   Gait abnormalities, frequent fall,  Length-dependent sensory loss,   Much improved with physical therapy,  MRI cervical spine showed no significant canal foraminal stenosis   Lacunar infarction by MRI of the brain Vascular Risk factor of aging, diabetes, Continue aspirin 81 mg daily Echocardiogram no ultrasound of carotid artery showed no significant abnormality  Continue moderate exercise, follow-up with primary care DIAGNOSTIC DATA (LABS, IMAGING, TESTING) - I reviewed patient records, labs, notes, testing and imaging myself where available.   MEDICAL HISTORY:  Peter Richmond, is a 89 year old male, seen in request by his primary care physician Dr.   Shelia Media, [place] Jew, for evaluation of unsteady gait, he is accompanied by his son at today's visit February 13, 2022   I reviewed and summarized the referring note. PMHX DM since 2023 Esophageal Cancer in 2006, sugery,  Hx of kidney stone,  Patient remains active but since 2022, he had a few fall episode, could not clearly remember most recent falls.  In September 23, when trying to get up from kneeling down position, he fell, did not loss consciousness  He no longer feel confident walking in his neighborhood, worried about his gait, full to exercise on the treadmill, he has good appetite, sleep well, no bladder and bowel incontinence,  Personally reviewed MRI on Oct 6th 2023,  No acute intracranial abnormality or significant interval change. 2. 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 3. 2 small remote lacunar infarcts of the cerebellum.  UPDATE May 15 2022: He is accompanied by son at today's clinical visit, 1, physical therapy, doing really well, participating silver sneaker, bike stationary bike Denies neck pain, no bowel bladder  incontinence,  Personally reviewed MRI of cervical spine, in October 2023, mild degenerative changes no significant canal foraminal narrowing  Echocardiogram showed normal ejection fraction,  Ultrasound of carotid artery showed no significant large vessel disease, Less than 39% stenosis on left side  PHYSICAL EXAM:   Vitals:   05/15/22 1137  Weight: 191 lb (86.6 kg)    Body mass index is 23.87 kg/m.  PHYSICAL EXAMNIATION:  Gen: NAD, conversant, well nourised, well groomed                     Cardiovascular: Regular rate rhythm, no peripheral edema, warm, nontender. Eyes: Conjunctivae clear without exudates or hemorrhage Neck: Supple, no carotid bruits. Pulmonary: Clear to auscultation bilaterally   NEUROLOGICAL EXAM:  MENTAL STATUS: Speech/cognition: Awake, alert, oriented to history taking and casual conversation CRANIAL NERVES: CN II: Visual fields are full to confrontation. Pupils are round equal and briskly reactive to light. CN III, IV, VI: extraocular movement are normal. No ptosis. CN V: Facial sensation is intact to light touch CN VII: Face is symmetric with normal eye closure  CN VIII: Hearing is normal to causal conversation. CN IX, X: Phonation is normal. CN XI: Head turning and shoulder shrug are intact  MOTOR: There is no pronator drift of out-stretched arms. Muscle bulk and tone are normal. Muscle strength is normal.  REFLEXES: Reflexes are 2  and symmetric at the biceps, triceps, knees, and absent at ankles.   SENSORY: Mild length-dependent sensory changes  COORDINATION: There is no trunk or limb dysmetria noted.  GAIT/STANCE: Can get up from seated position arm crossed  steady, cautious  REVIEW OF SYSTEMS:  Full 14 system review of systems performed and notable only for as above All other review of systems were negative.   ALLERGIES: Allergies  Allergen Reactions   Prevacid [Lansoprazole] Rash    HOME MEDICATIONS: Current Outpatient  Medications  Medication Sig Dispense Refill   Aspirin 81 MG CAPS Take by mouth.     carboxymethylcellulose (REFRESH PLUS) 0.5 % SOLN 1 drop 4 (four) times daily as needed (dry eyes).     cholecalciferol (VITAMIN D) 1000 units tablet Take 1,000 Units by mouth daily.     cyclobenzaprine (FLEXERIL) 10 MG tablet Take 10 mg by mouth 3 (three) times daily as needed for muscle spasms.     ibuprofen (ADVIL) 800 MG tablet Take 1 tablet (800 mg total) by mouth every 8 (eight) hours as needed. 30 tablet 0   metFORMIN (GLUCOPHAGE-XR) 500 MG 24 hr tablet Take 500 mg by mouth daily.     Omega-3 Fatty Acids (FISH OIL) 1000 MG CAPS Take 1,000 mg by mouth daily.     omeprazole (PRILOSEC) 20 MG capsule Take 20 mg by mouth 2 (two) times daily before a meal.     rosuvastatin (CRESTOR) 5 MG tablet Take 5 mg by mouth daily.     tamsulosin (FLOMAX) 0.4 MG CAPS capsule Take 0.4 mg by mouth daily.     vitamin B-12 (CYANOCOBALAMIN) 1000 MCG tablet Take 1,000 mcg by mouth daily.     No current facility-administered medications for this visit.    PAST MEDICAL HISTORY: Past Medical History:  Diagnosis Date   Calculus of right kidney    Cancer ([place])    hx of esophageal cancer- 2006    GERD (gastroesophageal reflux disease)    History of esophageal cancer    S/P ESOPHAGECTOMY 2006 (STAGE I)  NO CHEMORADIATION--  NO RECURRENCE   History of kidney stones    Poison oak    recent exposure at time of preop on 11/09/17 - on right arm and on back of legs per patient- almost healed    Pre-diabetes    Right bundle branch block     PAST SURGICAL HISTORY: Past Surgical History:  Procedure Laterality Date   cataract surgery      bilateral    CYSTOSCOPY W/ URETERAL STENT PLACEMENT Right 01/10/2013   Procedure: CYSTOSCOPY WITH RETROGRADE PYELOGRAM/URETERAL STENT PLACEMENT;  Surgeon: Claybon Jabs, MD;  Location: [facility];  Service: Urology;  Laterality: Right;   INGUINAL HERNIA REPAIR Left 05/07/2021    Procedure: OPEN LEFT INGUINAL HERNIA REPAIR WITH MESH;  Surgeon: Kinsinger, Arta Bruce, MD;  [facility];  Service: General;  Laterality: Left;   IR URETERAL STENT RIGHT NEW ACCESS W/O SEP NEPHROSTOMY CATH  11/13/2017   LAPAROSCOPIC CHOLECYSTECTOMY  06-24-2005   NEPHROLITHOTOMY Right 11/13/2017   Procedure: NEPHROLITHOTOMY PERCUTANEOUS;  Surgeon: Kathie Rhodes, MD;  [facility];  Service: Urology;  Laterality: Right;   TONSILLECTOMY  1943   TRANSHIATAL ESOPHAGECTOMY/ JEJUNOSTOMY/ PYLOROPLASTY  03-31-2005  DR Arlyce Dice   ESOPHAGEAL CANCER--  NO RECURRENCE    FAMILY HISTORY: Family History  Problem Relation Age of Onset   Pancreatic cancer Father    Parkinson's disease Sister     SOCIAL HISTORY: Social History   Socioeconomic History   Marital status: Widowed    Spouse name: Not on file   Number of children: 2   Years of education: Not on file   Highest education level: Bachelor's degree (e.g., BA, AB,  BS)  Occupational History    Comment: retired  Tobacco Use   Smoking status: Former    Years: 20.00    Types: Cigarettes    Quit date: 01/01/1984    Years since quitting: 38.3   Smokeless tobacco: Never   Tobacco comments:    was an [place] OFF   Vaping Use   Vaping Use: Never used  Substance and Sexual Activity   Alcohol use: Never   Drug use: Never   Sexual activity: Not on file  Other Topics Concern   Not on file  Social History Narrative   Lives alone   Social Determinants of Health   Financial Resource Strain: Not on file  Food Insecurity: Not on file  Transportation Needs: Not on file  Physical Activity: Not on file  Stress: Not on file  Social Connections: Not on file  Intimate Partner Violence: Not on file      Marcial Pacas, M.D. Ph.D.  [facility] [address], [place], [place] [address] Ph: [phone] Fax: [phone]  CC:  Deland Pretty, MD [place],  [place] [address]   Deland Pretty, MD

## 2022-05-21 DIAGNOSIS — R2681 Unsteadiness on feet: Secondary | ICD-10-CM | POA: Diagnosis not present

## 2022-05-28 DIAGNOSIS — R2681 Unsteadiness on feet: Secondary | ICD-10-CM | POA: Diagnosis not present

## 2022-06-02 DIAGNOSIS — K222 Esophageal obstruction: Secondary | ICD-10-CM | POA: Diagnosis not present

## 2022-06-02 DIAGNOSIS — R1310 Dysphagia, unspecified: Secondary | ICD-10-CM | POA: Diagnosis not present

## 2022-06-02 DIAGNOSIS — R197 Diarrhea, unspecified: Secondary | ICD-10-CM | POA: Diagnosis not present

## 2022-06-04 DIAGNOSIS — R2681 Unsteadiness on feet: Secondary | ICD-10-CM | POA: Diagnosis not present

## 2022-06-16 DIAGNOSIS — J069 Acute upper respiratory infection, unspecified: Secondary | ICD-10-CM | POA: Diagnosis not present

## 2022-06-25 DIAGNOSIS — R2681 Unsteadiness on feet: Secondary | ICD-10-CM | POA: Diagnosis not present

## 2022-07-04 DIAGNOSIS — R2681 Unsteadiness on feet: Secondary | ICD-10-CM | POA: Diagnosis not present

## 2022-07-08 DIAGNOSIS — R2681 Unsteadiness on feet: Secondary | ICD-10-CM | POA: Diagnosis not present

## 2022-07-09 DIAGNOSIS — R197 Diarrhea, unspecified: Secondary | ICD-10-CM | POA: Diagnosis not present

## 2022-07-16 DIAGNOSIS — R2681 Unsteadiness on feet: Secondary | ICD-10-CM | POA: Diagnosis not present

## 2022-08-07 DIAGNOSIS — R2681 Unsteadiness on feet: Secondary | ICD-10-CM | POA: Diagnosis not present

## 2022-08-14 DIAGNOSIS — R2681 Unsteadiness on feet: Secondary | ICD-10-CM | POA: Diagnosis not present

## 2022-08-21 DIAGNOSIS — R2681 Unsteadiness on feet: Secondary | ICD-10-CM | POA: Diagnosis not present

## 2022-08-28 DIAGNOSIS — R2681 Unsteadiness on feet: Secondary | ICD-10-CM | POA: Diagnosis not present

## 2022-09-04 DIAGNOSIS — R2681 Unsteadiness on feet: Secondary | ICD-10-CM | POA: Diagnosis not present

## 2022-09-11 DIAGNOSIS — R2681 Unsteadiness on feet: Secondary | ICD-10-CM | POA: Diagnosis not present

## 2022-09-25 DIAGNOSIS — R2681 Unsteadiness on feet: Secondary | ICD-10-CM | POA: Diagnosis not present

## 2022-10-02 DIAGNOSIS — R2681 Unsteadiness on feet: Secondary | ICD-10-CM | POA: Diagnosis not present

## 2022-10-09 DIAGNOSIS — R2681 Unsteadiness on feet: Secondary | ICD-10-CM | POA: Diagnosis not present

## 2022-10-23 DIAGNOSIS — R2681 Unsteadiness on feet: Secondary | ICD-10-CM | POA: Diagnosis not present

## 2022-10-28 DIAGNOSIS — R2681 Unsteadiness on feet: Secondary | ICD-10-CM | POA: Diagnosis not present

## 2022-11-06 DIAGNOSIS — R2681 Unsteadiness on feet: Secondary | ICD-10-CM | POA: Diagnosis not present

## 2022-11-13 DIAGNOSIS — R2681 Unsteadiness on feet: Secondary | ICD-10-CM | POA: Diagnosis not present

## 2022-11-21 DIAGNOSIS — R2681 Unsteadiness on feet: Secondary | ICD-10-CM | POA: Diagnosis not present

## 2022-11-27 DIAGNOSIS — R2681 Unsteadiness on feet: Secondary | ICD-10-CM | POA: Diagnosis not present

## 2022-12-01 DIAGNOSIS — H26493 Other secondary cataract, bilateral: Secondary | ICD-10-CM | POA: Diagnosis not present

## 2022-12-01 DIAGNOSIS — M1990 Unspecified osteoarthritis, unspecified site: Secondary | ICD-10-CM | POA: Diagnosis not present

## 2022-12-01 DIAGNOSIS — K219 Gastro-esophageal reflux disease without esophagitis: Secondary | ICD-10-CM | POA: Diagnosis not present

## 2022-12-01 DIAGNOSIS — G603 Idiopathic progressive neuropathy: Secondary | ICD-10-CM | POA: Diagnosis not present

## 2022-12-01 DIAGNOSIS — K2270 Barrett's esophagus without dysplasia: Secondary | ICD-10-CM | POA: Diagnosis not present

## 2022-12-01 DIAGNOSIS — Z961 Presence of intraocular lens: Secondary | ICD-10-CM | POA: Diagnosis not present

## 2022-12-01 DIAGNOSIS — I129 Hypertensive chronic kidney disease with stage 1 through stage 4 chronic kidney disease, or unspecified chronic kidney disease: Secondary | ICD-10-CM | POA: Diagnosis not present

## 2022-12-01 DIAGNOSIS — R32 Unspecified urinary incontinence: Secondary | ICD-10-CM | POA: Diagnosis not present

## 2022-12-01 DIAGNOSIS — N189 Chronic kidney disease, unspecified: Secondary | ICD-10-CM | POA: Diagnosis not present

## 2022-12-01 DIAGNOSIS — Z0100 Encounter for examination of eyes and vision without abnormal findings: Secondary | ICD-10-CM | POA: Diagnosis not present

## 2022-12-01 DIAGNOSIS — E785 Hyperlipidemia, unspecified: Secondary | ICD-10-CM | POA: Diagnosis not present

## 2022-12-01 DIAGNOSIS — H18513 Endothelial corneal dystrophy, bilateral: Secondary | ICD-10-CM | POA: Diagnosis not present

## 2022-12-01 DIAGNOSIS — Z008 Encounter for other general examination: Secondary | ICD-10-CM | POA: Diagnosis not present

## 2022-12-01 DIAGNOSIS — E1122 Type 2 diabetes mellitus with diabetic chronic kidney disease: Secondary | ICD-10-CM | POA: Diagnosis not present

## 2022-12-01 DIAGNOSIS — N400 Enlarged prostate without lower urinary tract symptoms: Secondary | ICD-10-CM | POA: Diagnosis not present

## 2022-12-01 DIAGNOSIS — J45909 Unspecified asthma, uncomplicated: Secondary | ICD-10-CM | POA: Diagnosis not present

## 2022-12-01 DIAGNOSIS — M4800 Spinal stenosis, site unspecified: Secondary | ICD-10-CM | POA: Diagnosis not present

## 2022-12-04 DIAGNOSIS — R2681 Unsteadiness on feet: Secondary | ICD-10-CM | POA: Diagnosis not present

## 2022-12-11 DIAGNOSIS — R2681 Unsteadiness on feet: Secondary | ICD-10-CM | POA: Diagnosis not present

## 2022-12-18 DIAGNOSIS — R2681 Unsteadiness on feet: Secondary | ICD-10-CM | POA: Diagnosis not present

## 2022-12-23 DIAGNOSIS — E1169 Type 2 diabetes mellitus with other specified complication: Secondary | ICD-10-CM | POA: Diagnosis not present

## 2022-12-23 DIAGNOSIS — Z8673 Personal history of transient ischemic attack (TIA), and cerebral infarction without residual deficits: Secondary | ICD-10-CM | POA: Diagnosis not present

## 2022-12-23 DIAGNOSIS — E7800 Pure hypercholesterolemia, unspecified: Secondary | ICD-10-CM | POA: Diagnosis not present

## 2022-12-25 DIAGNOSIS — R2681 Unsteadiness on feet: Secondary | ICD-10-CM | POA: Diagnosis not present

## 2022-12-31 DIAGNOSIS — E7800 Pure hypercholesterolemia, unspecified: Secondary | ICD-10-CM | POA: Diagnosis not present

## 2022-12-31 DIAGNOSIS — E1169 Type 2 diabetes mellitus with other specified complication: Secondary | ICD-10-CM | POA: Diagnosis not present

## 2023-01-01 DIAGNOSIS — R2681 Unsteadiness on feet: Secondary | ICD-10-CM | POA: Diagnosis not present

## 2023-01-07 DIAGNOSIS — D485 Neoplasm of uncertain behavior of skin: Secondary | ICD-10-CM | POA: Diagnosis not present

## 2023-01-07 DIAGNOSIS — C44321 Squamous cell carcinoma of skin of nose: Secondary | ICD-10-CM | POA: Diagnosis not present

## 2023-01-07 DIAGNOSIS — Z85828 Personal history of other malignant neoplasm of skin: Secondary | ICD-10-CM | POA: Diagnosis not present

## 2023-01-07 DIAGNOSIS — D0461 Carcinoma in situ of skin of right upper limb, including shoulder: Secondary | ICD-10-CM | POA: Diagnosis not present

## 2023-01-07 DIAGNOSIS — L918 Other hypertrophic disorders of the skin: Secondary | ICD-10-CM | POA: Diagnosis not present

## 2023-01-08 DIAGNOSIS — R2681 Unsteadiness on feet: Secondary | ICD-10-CM | POA: Diagnosis not present

## 2023-01-15 DIAGNOSIS — R2681 Unsteadiness on feet: Secondary | ICD-10-CM | POA: Diagnosis not present

## 2023-01-22 DIAGNOSIS — R2681 Unsteadiness on feet: Secondary | ICD-10-CM | POA: Diagnosis not present

## 2023-01-29 DIAGNOSIS — R2681 Unsteadiness on feet: Secondary | ICD-10-CM | POA: Diagnosis not present

## 2023-02-04 DIAGNOSIS — R2681 Unsteadiness on feet: Secondary | ICD-10-CM | POA: Diagnosis not present

## 2023-02-05 DIAGNOSIS — R748 Abnormal levels of other serum enzymes: Secondary | ICD-10-CM | POA: Diagnosis not present

## 2023-02-05 DIAGNOSIS — I700 Atherosclerosis of aorta: Secondary | ICD-10-CM | POA: Diagnosis not present

## 2023-02-09 DIAGNOSIS — K529 Noninfective gastroenteritis and colitis, unspecified: Secondary | ICD-10-CM | POA: Diagnosis not present

## 2023-02-09 DIAGNOSIS — Z0000 Encounter for general adult medical examination without abnormal findings: Secondary | ICD-10-CM | POA: Diagnosis not present

## 2023-02-09 DIAGNOSIS — G629 Polyneuropathy, unspecified: Secondary | ICD-10-CM | POA: Diagnosis not present

## 2023-02-09 DIAGNOSIS — K219 Gastro-esophageal reflux disease without esophagitis: Secondary | ICD-10-CM | POA: Diagnosis not present

## 2023-02-09 DIAGNOSIS — Z23 Encounter for immunization: Secondary | ICD-10-CM | POA: Diagnosis not present

## 2023-02-09 DIAGNOSIS — I440 Atrioventricular block, first degree: Secondary | ICD-10-CM | POA: Diagnosis not present

## 2023-02-09 DIAGNOSIS — R351 Nocturia: Secondary | ICD-10-CM | POA: Diagnosis not present

## 2023-02-09 DIAGNOSIS — Z8673 Personal history of transient ischemic attack (TIA), and cerebral infarction without residual deficits: Secondary | ICD-10-CM | POA: Diagnosis not present

## 2023-02-09 DIAGNOSIS — C44321 Squamous cell carcinoma of skin of nose: Secondary | ICD-10-CM | POA: Diagnosis not present

## 2023-02-09 DIAGNOSIS — I700 Atherosclerosis of aorta: Secondary | ICD-10-CM | POA: Diagnosis not present

## 2023-02-09 DIAGNOSIS — E1169 Type 2 diabetes mellitus with other specified complication: Secondary | ICD-10-CM | POA: Diagnosis not present

## 2023-02-09 DIAGNOSIS — R748 Abnormal levels of other serum enzymes: Secondary | ICD-10-CM | POA: Diagnosis not present

## 2023-02-09 DIAGNOSIS — R2681 Unsteadiness on feet: Secondary | ICD-10-CM | POA: Diagnosis not present

## 2023-02-09 DIAGNOSIS — M5459 Other low back pain: Secondary | ICD-10-CM | POA: Diagnosis not present

## 2023-02-12 DIAGNOSIS — R2681 Unsteadiness on feet: Secondary | ICD-10-CM | POA: Diagnosis not present

## 2023-02-16 DIAGNOSIS — D0461 Carcinoma in situ of skin of right upper limb, including shoulder: Secondary | ICD-10-CM | POA: Diagnosis not present

## 2023-02-19 DIAGNOSIS — R2681 Unsteadiness on feet: Secondary | ICD-10-CM | POA: Diagnosis not present

## 2023-02-26 DIAGNOSIS — R2681 Unsteadiness on feet: Secondary | ICD-10-CM | POA: Diagnosis not present

## 2023-03-05 DIAGNOSIS — R2681 Unsteadiness on feet: Secondary | ICD-10-CM | POA: Diagnosis not present

## 2023-03-12 DIAGNOSIS — R2681 Unsteadiness on feet: Secondary | ICD-10-CM | POA: Diagnosis not present

## 2023-03-19 DIAGNOSIS — R2681 Unsteadiness on feet: Secondary | ICD-10-CM | POA: Diagnosis not present

## 2023-03-23 DIAGNOSIS — R69 Illness, unspecified: Secondary | ICD-10-CM | POA: Diagnosis not present

## 2023-03-30 DIAGNOSIS — R69 Illness, unspecified: Secondary | ICD-10-CM | POA: Diagnosis not present

## 2023-04-16 DIAGNOSIS — R2681 Unsteadiness on feet: Secondary | ICD-10-CM | POA: Diagnosis not present

## 2023-04-30 DIAGNOSIS — R2681 Unsteadiness on feet: Secondary | ICD-10-CM | POA: Diagnosis not present

## 2023-05-04 DIAGNOSIS — H18513 Endothelial corneal dystrophy, bilateral: Secondary | ICD-10-CM | POA: Diagnosis not present

## 2023-05-04 DIAGNOSIS — H26493 Other secondary cataract, bilateral: Secondary | ICD-10-CM | POA: Diagnosis not present

## 2023-05-04 DIAGNOSIS — Z961 Presence of intraocular lens: Secondary | ICD-10-CM | POA: Diagnosis not present

## 2023-05-14 DIAGNOSIS — R2681 Unsteadiness on feet: Secondary | ICD-10-CM | POA: Diagnosis not present

## 2023-05-20 DIAGNOSIS — D225 Melanocytic nevi of trunk: Secondary | ICD-10-CM | POA: Diagnosis not present

## 2023-05-20 DIAGNOSIS — D1801 Hemangioma of skin and subcutaneous tissue: Secondary | ICD-10-CM | POA: Diagnosis not present

## 2023-05-20 DIAGNOSIS — Z85828 Personal history of other malignant neoplasm of skin: Secondary | ICD-10-CM | POA: Diagnosis not present

## 2023-05-20 DIAGNOSIS — L853 Xerosis cutis: Secondary | ICD-10-CM | POA: Diagnosis not present

## 2023-05-20 DIAGNOSIS — L570 Actinic keratosis: Secondary | ICD-10-CM | POA: Diagnosis not present

## 2023-05-20 DIAGNOSIS — D2272 Melanocytic nevi of left lower limb, including hip: Secondary | ICD-10-CM | POA: Diagnosis not present

## 2023-05-20 DIAGNOSIS — L821 Other seborrheic keratosis: Secondary | ICD-10-CM | POA: Diagnosis not present

## 2023-05-20 DIAGNOSIS — L814 Other melanin hyperpigmentation: Secondary | ICD-10-CM | POA: Diagnosis not present

## 2023-05-21 DIAGNOSIS — R2681 Unsteadiness on feet: Secondary | ICD-10-CM | POA: Diagnosis not present

## 2023-05-26 DIAGNOSIS — Z8673 Personal history of transient ischemic attack (TIA), and cerebral infarction without residual deficits: Secondary | ICD-10-CM | POA: Diagnosis not present

## 2023-05-26 DIAGNOSIS — E1169 Type 2 diabetes mellitus with other specified complication: Secondary | ICD-10-CM | POA: Diagnosis not present

## 2023-05-28 DIAGNOSIS — R2681 Unsteadiness on feet: Secondary | ICD-10-CM | POA: Diagnosis not present

## 2023-06-04 DIAGNOSIS — R2681 Unsteadiness on feet: Secondary | ICD-10-CM | POA: Diagnosis not present

## 2023-06-25 DIAGNOSIS — R2681 Unsteadiness on feet: Secondary | ICD-10-CM | POA: Diagnosis not present

## 2023-06-25 DIAGNOSIS — Z9181 History of falling: Secondary | ICD-10-CM | POA: Diagnosis not present

## 2023-06-25 DIAGNOSIS — S0083XA Contusion of other part of head, initial encounter: Secondary | ICD-10-CM | POA: Diagnosis not present

## 2023-07-09 DIAGNOSIS — R2681 Unsteadiness on feet: Secondary | ICD-10-CM | POA: Diagnosis not present

## 2023-07-30 DIAGNOSIS — R2681 Unsteadiness on feet: Secondary | ICD-10-CM | POA: Diagnosis not present

## 2023-08-06 DIAGNOSIS — R2681 Unsteadiness on feet: Secondary | ICD-10-CM | POA: Diagnosis not present

## 2023-08-07 DIAGNOSIS — Z8349 Family history of other endocrine, nutritional and metabolic diseases: Secondary | ICD-10-CM | POA: Diagnosis not present

## 2023-08-07 DIAGNOSIS — E611 Iron deficiency: Secondary | ICD-10-CM | POA: Diagnosis not present

## 2023-08-07 DIAGNOSIS — R42 Dizziness and giddiness: Secondary | ICD-10-CM | POA: Diagnosis not present

## 2023-08-20 DIAGNOSIS — R2681 Unsteadiness on feet: Secondary | ICD-10-CM | POA: Diagnosis not present

## 2023-09-01 DIAGNOSIS — R2681 Unsteadiness on feet: Secondary | ICD-10-CM | POA: Diagnosis not present

## 2023-09-02 DIAGNOSIS — E1122 Type 2 diabetes mellitus with diabetic chronic kidney disease: Secondary | ICD-10-CM | POA: Diagnosis not present

## 2023-09-02 DIAGNOSIS — E785 Hyperlipidemia, unspecified: Secondary | ICD-10-CM | POA: Diagnosis not present

## 2023-09-02 DIAGNOSIS — N400 Enlarged prostate without lower urinary tract symptoms: Secondary | ICD-10-CM | POA: Diagnosis not present

## 2023-09-02 DIAGNOSIS — Z833 Family history of diabetes mellitus: Secondary | ICD-10-CM | POA: Diagnosis not present

## 2023-09-02 DIAGNOSIS — Z7982 Long term (current) use of aspirin: Secondary | ICD-10-CM | POA: Diagnosis not present

## 2023-09-02 DIAGNOSIS — K219 Gastro-esophageal reflux disease without esophagitis: Secondary | ICD-10-CM | POA: Diagnosis not present

## 2023-09-02 DIAGNOSIS — L129 Pemphigoid, unspecified: Secondary | ICD-10-CM | POA: Diagnosis not present

## 2023-09-02 DIAGNOSIS — Z008 Encounter for other general examination: Secondary | ICD-10-CM | POA: Diagnosis not present

## 2023-09-02 DIAGNOSIS — Z7984 Long term (current) use of oral hypoglycemic drugs: Secondary | ICD-10-CM | POA: Diagnosis not present

## 2023-09-02 DIAGNOSIS — M1990 Unspecified osteoarthritis, unspecified site: Secondary | ICD-10-CM | POA: Diagnosis not present

## 2023-09-02 DIAGNOSIS — D6949 Other primary thrombocytopenia: Secondary | ICD-10-CM | POA: Diagnosis not present

## 2023-09-02 DIAGNOSIS — I129 Hypertensive chronic kidney disease with stage 1 through stage 4 chronic kidney disease, or unspecified chronic kidney disease: Secondary | ICD-10-CM | POA: Diagnosis not present

## 2023-09-02 DIAGNOSIS — I8500 Esophageal varices without bleeding: Secondary | ICD-10-CM | POA: Diagnosis not present

## 2023-09-10 DIAGNOSIS — R2681 Unsteadiness on feet: Secondary | ICD-10-CM | POA: Diagnosis not present

## 2023-09-17 DIAGNOSIS — R2681 Unsteadiness on feet: Secondary | ICD-10-CM | POA: Diagnosis not present

## 2023-09-23 DIAGNOSIS — R2681 Unsteadiness on feet: Secondary | ICD-10-CM | POA: Diagnosis not present

## 2023-09-24 DIAGNOSIS — R29898 Other symptoms and signs involving the musculoskeletal system: Secondary | ICD-10-CM | POA: Diagnosis not present

## 2023-09-24 DIAGNOSIS — R748 Abnormal levels of other serum enzymes: Secondary | ICD-10-CM | POA: Diagnosis not present

## 2023-09-24 DIAGNOSIS — E1169 Type 2 diabetes mellitus with other specified complication: Secondary | ICD-10-CM | POA: Diagnosis not present

## 2023-09-24 DIAGNOSIS — Z8673 Personal history of transient ischemic attack (TIA), and cerebral infarction without residual deficits: Secondary | ICD-10-CM | POA: Diagnosis not present

## 2023-09-24 DIAGNOSIS — D508 Other iron deficiency anemias: Secondary | ICD-10-CM | POA: Diagnosis not present

## 2023-09-24 DIAGNOSIS — R5383 Other fatigue: Secondary | ICD-10-CM | POA: Diagnosis not present

## 2023-10-01 DIAGNOSIS — R29898 Other symptoms and signs involving the musculoskeletal system: Secondary | ICD-10-CM | POA: Diagnosis not present

## 2023-10-01 DIAGNOSIS — N401 Enlarged prostate with lower urinary tract symptoms: Secondary | ICD-10-CM | POA: Diagnosis not present

## 2023-10-01 DIAGNOSIS — B3781 Candidal esophagitis: Secondary | ICD-10-CM | POA: Diagnosis not present

## 2023-10-01 DIAGNOSIS — N481 Balanitis: Secondary | ICD-10-CM | POA: Diagnosis not present

## 2023-10-01 DIAGNOSIS — R0989 Other specified symptoms and signs involving the circulatory and respiratory systems: Secondary | ICD-10-CM | POA: Diagnosis not present

## 2023-10-07 DIAGNOSIS — R490 Dysphonia: Secondary | ICD-10-CM | POA: Diagnosis not present

## 2023-10-07 DIAGNOSIS — K219 Gastro-esophageal reflux disease without esophagitis: Secondary | ICD-10-CM | POA: Diagnosis not present

## 2023-10-07 DIAGNOSIS — R09A2 Foreign body sensation, throat: Secondary | ICD-10-CM | POA: Diagnosis not present

## 2023-10-08 DIAGNOSIS — R2681 Unsteadiness on feet: Secondary | ICD-10-CM | POA: Diagnosis not present

## 2023-10-14 DIAGNOSIS — R0989 Other specified symptoms and signs involving the circulatory and respiratory systems: Secondary | ICD-10-CM | POA: Diagnosis not present

## 2023-10-22 DIAGNOSIS — R2681 Unsteadiness on feet: Secondary | ICD-10-CM | POA: Diagnosis not present

## 2023-11-05 DIAGNOSIS — R2681 Unsteadiness on feet: Secondary | ICD-10-CM | POA: Diagnosis not present

## 2023-11-12 DIAGNOSIS — R2681 Unsteadiness on feet: Secondary | ICD-10-CM | POA: Diagnosis not present

## 2023-11-16 DIAGNOSIS — R3912 Poor urinary stream: Secondary | ICD-10-CM | POA: Diagnosis not present

## 2023-11-16 DIAGNOSIS — N401 Enlarged prostate with lower urinary tract symptoms: Secondary | ICD-10-CM | POA: Diagnosis not present

## 2023-11-16 DIAGNOSIS — R351 Nocturia: Secondary | ICD-10-CM | POA: Diagnosis not present

## 2023-11-26 DIAGNOSIS — R2681 Unsteadiness on feet: Secondary | ICD-10-CM | POA: Diagnosis not present

## 2023-12-03 DIAGNOSIS — R2681 Unsteadiness on feet: Secondary | ICD-10-CM | POA: Diagnosis not present

## 2023-12-08 ENCOUNTER — Encounter: Payer: Self-pay | Admitting: Neurology

## 2023-12-08 ENCOUNTER — Ambulatory Visit: Admitting: Neurology

## 2023-12-08 VITALS — BP 128/77 | HR 70 | Resp 14 | Ht 75.0 in | Wt 185.5 lb

## 2023-12-08 DIAGNOSIS — R269 Unspecified abnormalities of gait and mobility: Secondary | ICD-10-CM

## 2023-12-08 DIAGNOSIS — I6381 Other cerebral infarction due to occlusion or stenosis of small artery: Secondary | ICD-10-CM | POA: Diagnosis not present

## 2023-12-08 DIAGNOSIS — R159 Full incontinence of feces: Secondary | ICD-10-CM | POA: Insufficient documentation

## 2023-12-08 NOTE — Progress Notes (Signed)
 Chief Complaint  Patient presents with   New Patient (Initial Visit)    RM14, alone, referral for worsening balance and falls/Dr. Ryan Hives [facility] [phone]: pt reported frequent falls (fall screening completed), worsening balance since last visit which impact falling       ASSESSMENT AND PLAN  Peter Richmond is a 89 y.o. male   Slow Worsening gait abnormality  Examination showed mild right ankle dorsiflexion weakness, brisk patellar reflex,  Previous MRI of brain October 2023 showed remote lacunar infarction at right caudate head, cerebellum  MRI of cervical spine showed mild degenerative changes, but there was no evidence of cord compression to explain his gait abnormality, also a year history of bowel and bladder incontinence,  MRI of thoracic spine lumbar spine with without contrast  Follow up depend on MRI findings  DIAGNOSTIC DATA (LABS, IMAGING, TESTING) - I reviewed patient records, labs, notes, testing and imaging myself where available.   MEDICAL HISTORY:  Peter Richmond is a 89 year old male, seen in request by his primary care physician Dr. Hives, Ryan for evaluation of worsening gait abnormality, initial evaluation December 08, 2023    History is obtained from the patient and review of electronic medical records. I personally reviewed pertinent available imaging films in PACS.   PMHx of  Hx of kidney stone HLD DM Stroke Esophageal Cancer in 2006, s/p surgery,   He lives alone, still active, go to the gym regularly, noticed gradual worsening gait abnormality since 2023, occasionally, difficulty picking up his right foot,  He to have chronic low back pain, achiness, stay at midline, deny radiating pain,   He no longer feel confident walking in his neighborhood, worried about his gait, treadmill, he has good appetite, sleep well, no bladder and bowel incontinence,   Personally reviewed MRI on Oct 6th 2023,  No acute intracranial abnormality or  significant interval change. 2. 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 3. 2 small remote lacunar infarcts of the cerebellum.  MRI of the cervical spine showed mild degenerative changes but no cord compression  MRI of thoracic spine was essentially normal PHYSICAL EXAM:   Vitals:   12/08/23 1030  BP: 128/77  Pulse: 70  Resp: 14  SpO2: 96%  Weight: 185 lb 8 oz (84.1 kg)  Height: 6' 3 (1.905 m)   Not recorded     Body mass index is 23.19 kg/m.  PHYSICAL EXAMNIATION:  Gen: NAD, conversant, well nourised, well groomed                     Cardiovascular: Regular rate rhythm, no peripheral edema, warm, nontender. Eyes: Conjunctivae clear without exudates or hemorrhage Neck: Supple, no carotid bruits. Pulmonary: Clear to auscultation bilaterally   NEUROLOGICAL EXAM:  MENTAL STATUS: Speech/cognition: Awake, alert, oriented to history taking and casual conversation CRANIAL NERVES: CN II: Visual fields are full to confrontation. Pupils are round equal and briskly reactive to light. CN III, IV, VI: extraocular movement are normal. No ptosis. CN V: Facial sensation is intact to light touch CN VII: Face is symmetric with normal eye closure  CN VIII: Hearing is normal to causal conversation. CN IX, X: Phonation is normal. CN XI: Head turning and shoulder shrug are intact  MOTOR: Mild right ankle dorsiflexion weakness, rest of the muscle group testing was normal    REFLEXES: Reflexes are 2+ and symmetric at the biceps, triceps, knees, and ankles. Plantar responses are extensor bilaterally  SENSORY: Length-dependent decreased light  touch, pinprick to mid shin level, decreased vibratory sensation to knee level  COORDINATION: There is no trunk or limb dysmetria noted.  GAIT/STANCE: Need push-up to get up from seated position, mild right foot drop  REVIEW OF SYSTEMS:  Full 14 system review of systems performed and notable only for as above All other review of  systems were negative.   ALLERGIES: Allergies  Allergen Reactions   Lansoprazole Rash, Dermatitis and Hives    HOME MEDICATIONS: Current Outpatient Medications  Medication Sig Dispense Refill   Aspirin 81 MG CAPS Take by mouth.     carboxymethylcellulose (REFRESH PLUS) 0.5 % SOLN 1 drop 4 (four) times daily as needed (dry eyes).     cholecalciferol (VITAMIN D) 1000 units tablet Take 1,000 Units by mouth daily.     colestipol (COLESTID) 5 g granules Take 5 g by mouth daily.     cyclobenzaprine (FLEXERIL) 10 MG tablet Take 10 mg by mouth 3 (three) times daily as needed for muscle spasms.     empagliflozin (JARDIANCE) 25 MG TABS tablet Take 12.5 mg by mouth daily.     ibuprofen  (ADVIL ) 800 MG tablet Take 1 tablet (800 mg total) by mouth every 8 (eight) hours as needed. 30 tablet 0   metFORMIN (GLUCOPHAGE-XR) 500 MG 24 hr tablet Take 500 mg by mouth daily.     Omega-3 Fatty Acids (FISH OIL) 1000 MG CAPS Take 1,000 mg by mouth daily.     omeprazole (PRILOSEC) 20 MG capsule Take 20 mg by mouth 2 (two) times daily before a meal.     rosuvastatin (CRESTOR) 5 MG tablet Take 5 mg by mouth daily.     tamsulosin  (FLOMAX ) 0.4 MG CAPS capsule Take 0.4 mg by mouth daily.     vitamin B-12 (CYANOCOBALAMIN) 1000 MCG tablet Take 1,000 mcg by mouth daily. (Patient taking differently: Take 1,000 mcg by mouth 3 (three) times a week.)     No current facility-administered medications for this visit.    PAST MEDICAL HISTORY: Past Medical History:  Diagnosis Date   Calculus of right kidney    Cancer ([facility])    hx of esophageal cancer- 2006    GERD (gastroesophageal reflux disease)    History of esophageal cancer    S/P ESOPHAGECTOMY 2006 (STAGE I)  NO CHEMORADIATION--  NO RECURRENCE   History of kidney stones    Poison oak    recent exposure at time of preop on 11/09/17 - on right arm and on back of legs per patient- almost healed    Pre-diabetes    Right bundle branch block     PAST SURGICAL  HISTORY: Past Surgical History:  Procedure Laterality Date   cataract surgery      bilateral    CYSTOSCOPY W/ URETERAL STENT PLACEMENT Right 01/10/2013   Procedure: CYSTOSCOPY WITH RETROGRADE PYELOGRAM/URETERAL STENT PLACEMENT;  Surgeon: Mark C Ottelin, MD;  Location: [facility];  Service: Urology;  Laterality: Right;   INGUINAL HERNIA REPAIR Left 05/07/2021   Procedure: OPEN LEFT INGUINAL HERNIA REPAIR WITH MESH;  Surgeon: Kinsinger, Herlene Righter, MD;  [facility];  Service: General;  Laterality: Left;   IR URETERAL STENT RIGHT NEW ACCESS W/O SEP NEPHROSTOMY CATH  11/13/2017   LAPAROSCOPIC CHOLECYSTECTOMY  06-24-2005   NEPHROLITHOTOMY Right 11/13/2017   Procedure: NEPHROLITHOTOMY PERCUTANEOUS;  Surgeon: Ceil Anes, MD;  [facility];  Service: Urology;  Laterality: Right;   TONSILLECTOMY  1943   TRANSHIATAL ESOPHAGECTOMY/ JEJUNOSTOMY/ PYLOROPLASTY  03-31-2005  DR BRANTLEY  ESOPHAGEAL CANCER--  NO RECURRENCE    FAMILY HISTORY: Family History  Problem Relation Age of Onset   Pancreatic cancer Father    Parkinson's disease Sister     SOCIAL HISTORY: Social History   Socioeconomic History   Marital status: Widowed    Spouse name: Not on file   Number of children: 2   Years of education: Not on file   Highest education level: Bachelor's degree (e.g., BA, AB, BS)  Occupational History    Comment: retired  Tobacco Use   Smoking status: Former    Current packs/day: 0.00    Types: Cigarettes    Start date: 01/01/1964    Quit date: 01/01/1984    Years since quitting: 39.9   Smokeless tobacco: Never   Tobacco comments:    was an [employer] ON/ OFF   Vaping Use   Vaping status: Never Used  Substance and Sexual Activity   Alcohol use: Never   Drug use: Never   Sexual activity: Not on file  Other Topics Concern   Not on file  Social History Narrative   Lives alone   Social Drivers of Health   Financial Resource Strain: Not on file  Food  Insecurity: Not on file  Transportation Needs: Not on file  Physical Activity: Not on file  Stress: Not on file  Social Connections: Not on file  Intimate Partner Violence: Not on file      Modena Callander, M.D. Ph.D.  [facility] [address] [place], [place] [address] Ph: [phone] Fax: [phone]  CC:  Clarice Nottingham, MD [address] [place],  [place] [address]  Clarice Nottingham, MD

## 2023-12-09 ENCOUNTER — Telehealth: Payer: Self-pay | Admitting: Neurology

## 2023-12-09 NOTE — Telephone Encounter (Signed)
 Orders sent to [facility] to schedule. [phone]

## 2023-12-10 ENCOUNTER — Encounter: Payer: Self-pay | Admitting: Neurology

## 2023-12-10 DIAGNOSIS — R2681 Unsteadiness on feet: Secondary | ICD-10-CM | POA: Diagnosis not present

## 2023-12-23 ENCOUNTER — Ambulatory Visit
Admission: RE | Admit: 2023-12-23 | Discharge: 2023-12-23 | Disposition: A | Discharge status: Home / Self Care | Source: Ambulatory Visit | Attending: Neurology | Admitting: Neurology

## 2023-12-23 DIAGNOSIS — R269 Unspecified abnormalities of gait and mobility: Secondary | ICD-10-CM

## 2023-12-23 DIAGNOSIS — R159 Full incontinence of feces: Secondary | ICD-10-CM

## 2023-12-23 DIAGNOSIS — I6381 Other cerebral infarction due to occlusion or stenosis of small artery: Secondary | ICD-10-CM

## 2023-12-23 MED ORDER — GADOPICLENOL 0.5 MMOL/ML IV SOLN
8.4000 mL | Freq: Once | INTRAVENOUS | Status: AC | PRN
  Administered 2023-12-23: 8.4 mL via INTRAVENOUS

## 2023-12-24 DIAGNOSIS — E1169 Type 2 diabetes mellitus with other specified complication: Secondary | ICD-10-CM | POA: Diagnosis not present

## 2023-12-24 DIAGNOSIS — Z8673 Personal history of transient ischemic attack (TIA), and cerebral infarction without residual deficits: Secondary | ICD-10-CM | POA: Diagnosis not present

## 2023-12-24 DIAGNOSIS — E7800 Pure hypercholesterolemia, unspecified: Secondary | ICD-10-CM | POA: Diagnosis not present

## 2023-12-24 DIAGNOSIS — Z7902 Long term (current) use of antithrombotics/antiplatelets: Secondary | ICD-10-CM | POA: Diagnosis not present

## 2023-12-29 ENCOUNTER — Telehealth: Payer: Self-pay | Admitting: Neurology

## 2023-12-29 DIAGNOSIS — R269 Unspecified abnormalities of gait and mobility: Secondary | ICD-10-CM

## 2023-12-29 NOTE — Telephone Encounter (Signed)
 Called and spoke to pt and results/recommendations given. Pt stated that its wuite a while between now and next appt is there anything that can be done now?

## 2023-12-29 NOTE — Addendum Note (Signed)
 Addended by: Pete Merten on: 12/29/2023 12:24 PM   Modules accepted: Orders

## 2023-12-29 NOTE — Telephone Encounter (Signed)
Orders Placed This Encounter  Procedures  . Ambulatory referral to Physical Therapy      

## 2023-12-29 NOTE — Telephone Encounter (Signed)
 Please call patient, MRI of lumbar spine showed multilevel degenerative changes, moderate spinal stenosis at L2-3, variable degree of foraminal narrowing  MRI of the thoracic spine was normal  Please give him a follow-up visit in 3 to 4 months, I will review MRI in detail with him then,    MRI lumbar spine (with and without) demonstrating: - At L2-3: disc bulging and facet hypertrophy with moderate spinal stenosis and mild right and moderate left foraminal stenosis. - At L3-4: disc bulging and facet hypertrophy and ligamentum flavum hypertrophy with mild spinal stenosis and mild right and moderate left foraminal stenosis. - At L4-5: disc bulging and facet hypertrophy and ligamentum flavum hypertrophy with moderate right and mild left foraminal stenosis.   IMPRESSION:    Normal MRI thoracic spine (with and without).

## 2023-12-29 NOTE — Telephone Encounter (Signed)
 Called and spoke directly to pt, relayed recommendations, pt stated that he sees pt already, pt wants you to send your referral to target those specific areas mentioned In result. Pt reported that he currently goes for balance and strength   SEND PT ORDER TO [facility]: PHONE: [phone]

## 2023-12-30 ENCOUNTER — Telehealth: Payer: Self-pay | Admitting: Neurology

## 2023-12-30 NOTE — Telephone Encounter (Signed)
 Referral for physical therapy fax to [facility]. Phone:[phone], Fax: [phone]

## 2023-12-31 DIAGNOSIS — R269 Unspecified abnormalities of gait and mobility: Secondary | ICD-10-CM | POA: Diagnosis not present

## 2023-12-31 DIAGNOSIS — R531 Weakness: Secondary | ICD-10-CM | POA: Diagnosis not present

## 2024-01-04 DIAGNOSIS — R269 Unspecified abnormalities of gait and mobility: Secondary | ICD-10-CM | POA: Diagnosis not present

## 2024-01-04 DIAGNOSIS — R531 Weakness: Secondary | ICD-10-CM | POA: Diagnosis not present

## 2024-01-07 DIAGNOSIS — R269 Unspecified abnormalities of gait and mobility: Secondary | ICD-10-CM | POA: Diagnosis not present

## 2024-01-07 DIAGNOSIS — R531 Weakness: Secondary | ICD-10-CM | POA: Diagnosis not present

## 2024-01-11 DIAGNOSIS — R531 Weakness: Secondary | ICD-10-CM | POA: Diagnosis not present

## 2024-01-11 DIAGNOSIS — R269 Unspecified abnormalities of gait and mobility: Secondary | ICD-10-CM | POA: Diagnosis not present

## 2024-01-12 ENCOUNTER — Ambulatory Visit: Attending: Cardiology | Admitting: Cardiology

## 2024-01-12 ENCOUNTER — Encounter: Payer: Self-pay | Admitting: Cardiology

## 2024-01-12 VITALS — BP 98/60 | HR 56 | Ht 75.0 in | Wt 177.0 lb

## 2024-01-12 DIAGNOSIS — I358 Other nonrheumatic aortic valve disorders: Secondary | ICD-10-CM

## 2024-01-12 DIAGNOSIS — R269 Unspecified abnormalities of gait and mobility: Secondary | ICD-10-CM | POA: Diagnosis not present

## 2024-01-12 DIAGNOSIS — I6381 Other cerebral infarction due to occlusion or stenosis of small artery: Secondary | ICD-10-CM

## 2024-01-12 NOTE — Progress Notes (Signed)
 Cardiology Office Note:  .   Date:  01/20/2024  ID:  Peter Richmond, DOB [birth year], MRN [id number] PCP: Clarice Nottingham, MD  [facility] Providers Cardiologist:  Alm Clay, MD     Chief Complaint  Patient presents with   New Patient (Initial Visit)    Evaluation of falls    Patient Profile: .     Peter Richmond is a very healthy appearing 89 y.o. male  with a PMH notable for HLD, DM-2 and prior CVA with iron deficiency anemia who presents here for evaluation for falls and weakness at the request of Clarice Nottingham, MD.  Other PMH includes GERD, unsteady gait, aortic atherosclerosis, 1 AVB, peripheral neuropathy with chronic low back pain    Peter Richmond was ostensibly referred for falls.  Subjective  Discussed the use of AI scribe software for clinical note transcription with the patient, who gave verbal consent to proceed.  History of Present Illness Peter Richmond is a 89 year old male who presents with falls and leg weakness. He was referred by Dr. Clarice for evaluation of falls and leg weakness.  He experiences falls and a sensation of leg weakness, particularly when walking or ascending inclines. His legs feel weak more when walking, and he has had several falls, notably landing on his right shoulder. He recalls the falls but does not report losing consciousness or experiencing dizziness at the time. His balance was more of an issue in 2022-2023, especially when turning quickly.  He has been undergoing physical therapy to address the leg weakness. No chest pain, shortness of breath, heart palpitations, or irregular heartbeats. He also denies any pain in his calves or thighs when walking.  His medication regimen includes aspirin, colestipol, Crestor 5 mg, metformin 500 mg daily, Jardiance 25 mg daily, fish oil, and omeprazole. His LDL was 36, total cholesterol was 96, HDL was 44, and triglycerides were 76 as of October last year. His A1c was 6.3 as of August  28th this year.  He has a history of esophageal cancer treated successfully with surgery in 2006, with no subsequent issues. He also reports a mini stroke around 2019-2020, which affected his memory and balance. He does not recall any specific treatment for the mini stroke at the time.     Objective   Medications: Aspirin 81 mg daily, Jardiance 12.5 mg daily, metformin XR 500 mg daily, rosuvastatin 5 mg daily and fish oil 1000 mg daily -Colestid 5 g daily; omeprazole 20 mg daily and Flomax  0.4 mg daily. -As needed Flexeril and Advil  -- Vitamin D3  Social history: He was born in [place] .  Now resident [place].  He has a living will with HCPOA (wife-right atrial); never smoked  Studies Reviewed: SABRA   EKG Interpretation Date/Time:  Tuesday January 12 2024 10:57:53 EDT Ventricular Rate:  56 PR Interval:  234 QRS Duration:  130 QT Interval:  464 QTC Calculation: 447 R Axis:   42  Text Interpretation: Sinus bradycardia with 1st degree A-V block Right bundle branch block No significant change since last tracing Confirmed by Clay Alm ([id number]) on 01/12/2024 11:31:34 AM    Results LABS Lipid panel: Total cholesterol 96, HDL 44, triglycerides 76, LDL 36 (01/2023) Chemistry panel That: NA 130, K+ 4.8, BUN 24,Cr 1.13, GLU 111.  Normal LFTs.  TSH 1.18.  WBC 9.4, Hgb 13.8, PLT 252 ferritin low at 30 NT-proBNP 44 HbA1c: 6.3 (12/24/2023)  RADIOLOGY Carotid ultrasound: Right carotid normal, left carotid <40% stenosis,  normal vertebral arteries  DIAGNOSTIC Echocardiogram: Normal ejection fraction 55-60%, no wall motion abnormalities, mild basal septum thickening, normal right ventricle, aortic valve sclerosis without stenosis, normal right atrial pressures (02/2022)   Risk Assessment/Calculations:          Physical Exam:   VS:  BP 98/60   Pulse (!) 56   Ht 6' 3 (1.905 m)   Wt 177 lb (80.3 kg)   SpO2 92%   BMI 22.12 kg/m    Wt Readings from Last 3 Encounters:  01/12/24  177 lb (80.3 kg)  12/08/23 185 lb 8 oz (84.1 kg)  05/15/22 191 lb (86.6 kg)     GEN: Well nourished, well groomed in no acute distress; appears healthy. NECK: No JVD; No carotid bruits CARDIAC: Normal S1, S2; RRR, 1/6 SEM at RUSB otherwise no murmurs, rubs, gallops RESPIRATORY:  Clear to auscultation without rales, wheezing or rhonchi ; nonlabored, good air movement. ABDOMEN: Soft, non-tender, non-distended EXTREMITIES:  No edema; No deformity      ASSESSMENT AND PLAN: .    Problem List Items Addressed This Visit       Cardiology Problems   Aortic valve sclerosis   Systolic ejection murmur heard on exam. Recent echo showed aortic valve sclerosis with calcium buildup, no stenosis.  Mild turbulent flow, ejection fraction 55-60%, no wall motion abnormalities. Condition stable, no intervention needed.        Other   Gait abnormality   Falls and leg weakness likely due to balance issues, possibly linked to past mini-stroke. Low blood pressure may contribute, but no dizziness or claudication reported. - Monitor blood pressure, consider supportive measures if hypotension persists.=> Discussed adequate hydration, he may benefit from midodrine at low doses. - Follow up with primary care for further evaluation.    If there was other concerns (potential PAD, would simply recommend ordering ABIs with potential reflex lower extremity arterial Dopplers.  I did not order that because he is not noticed any symptoms to suggest claudication.      Lacunar infarction [facility]) - Primary   Reported history of lacunar infarct which could explain some of his balance issues.-Tikosyn neurology.  He is on aspirin and statin as well as Jardiance and metformin.  With low blood pressures, no need for antihypertensives.      Relevant Orders   EKG 12-Lead (Completed)             Follow-Up: Return if symptoms worsen or fail to improve, for Followup when necessary.     Signed, Alm MICAEL Clay,  MD, MS Alm Clay, M.D., M.S. Interventional Cardiologist  [facility] Pager # [phone]

## 2024-01-12 NOTE — Patient Instructions (Addendum)
 Medication Instructions:   No changes     Lab Work: Not needed    Testing/Procedures:  Not needed  Follow-Up: At [facility], you and your health needs are our priority.  As part of our continuing mission to provide you with exceptional heart care, we have created designated Provider Care Teams.  These Care Teams include your primary Cardiologist (physician) and Advanced Practice Providers (APPs -  Physician Assistants and Nurse Practitioners) who all work together to provide you with the care you need, when you need it.     Your next appointment:   As needed   The format for your next appointment:   In Person  Provider:   Bryan Lemma, MD

## 2024-01-20 ENCOUNTER — Encounter: Payer: Self-pay | Admitting: Cardiology

## 2024-01-20 DIAGNOSIS — R269 Unspecified abnormalities of gait and mobility: Secondary | ICD-10-CM | POA: Diagnosis not present

## 2024-01-20 DIAGNOSIS — R531 Weakness: Secondary | ICD-10-CM | POA: Diagnosis not present

## 2024-01-20 DIAGNOSIS — I358 Other nonrheumatic aortic valve disorders: Secondary | ICD-10-CM | POA: Insufficient documentation

## 2024-01-20 NOTE — Assessment & Plan Note (Addendum)
 Falls and leg weakness likely due to balance issues, possibly linked to past mini-stroke. Low blood pressure may contribute, but no dizziness or claudication reported. - Monitor blood pressure, consider supportive measures if hypotension persists.=> Discussed adequate hydration, he may benefit from midodrine at low doses. - Follow up with primary care for further evaluation.    If there was other concerns (potential PAD, would simply recommend ordering ABIs with potential reflex lower extremity arterial Dopplers.  I did not order that because he is not noticed any symptoms to suggest claudication.

## 2024-01-20 NOTE — Assessment & Plan Note (Signed)
 Systolic ejection murmur heard on exam. Recent echo showed aortic valve sclerosis with calcium buildup, no stenosis.  Mild turbulent flow, ejection fraction 55-60%, no wall motion abnormalities. Condition stable, no intervention needed.

## 2024-01-20 NOTE — Assessment & Plan Note (Signed)
 Reported history of lacunar infarct which could explain some of his balance issues.-Tikosyn neurology.  He is on aspirin and statin as well as Jardiance and metformin.  With low blood pressures, no need for antihypertensives.

## 2024-02-10 DIAGNOSIS — Z8673 Personal history of transient ischemic attack (TIA), and cerebral infarction without residual deficits: Secondary | ICD-10-CM | POA: Diagnosis not present

## 2024-02-10 DIAGNOSIS — E1169 Type 2 diabetes mellitus with other specified complication: Secondary | ICD-10-CM | POA: Diagnosis not present

## 2024-02-10 DIAGNOSIS — E7800 Pure hypercholesterolemia, unspecified: Secondary | ICD-10-CM | POA: Diagnosis not present

## 2024-02-10 DIAGNOSIS — Z7902 Long term (current) use of antithrombotics/antiplatelets: Secondary | ICD-10-CM | POA: Diagnosis not present

## 2024-02-15 DIAGNOSIS — Z0000 Encounter for general adult medical examination without abnormal findings: Secondary | ICD-10-CM | POA: Diagnosis not present

## 2024-02-15 DIAGNOSIS — Z8673 Personal history of transient ischemic attack (TIA), and cerebral infarction without residual deficits: Secondary | ICD-10-CM | POA: Diagnosis not present

## 2024-02-15 DIAGNOSIS — Z7902 Long term (current) use of antithrombotics/antiplatelets: Secondary | ICD-10-CM | POA: Diagnosis not present

## 2024-02-15 DIAGNOSIS — L120 Bullous pemphigoid: Secondary | ICD-10-CM | POA: Diagnosis not present

## 2024-02-15 DIAGNOSIS — R197 Diarrhea, unspecified: Secondary | ICD-10-CM | POA: Diagnosis not present

## 2024-02-15 DIAGNOSIS — Z23 Encounter for immunization: Secondary | ICD-10-CM | POA: Diagnosis not present

## 2024-02-15 DIAGNOSIS — K219 Gastro-esophageal reflux disease without esophagitis: Secondary | ICD-10-CM | POA: Diagnosis not present

## 2024-02-15 DIAGNOSIS — N401 Enlarged prostate with lower urinary tract symptoms: Secondary | ICD-10-CM | POA: Diagnosis not present

## 2024-02-15 DIAGNOSIS — G629 Polyneuropathy, unspecified: Secondary | ICD-10-CM | POA: Diagnosis not present

## 2024-02-15 DIAGNOSIS — E1169 Type 2 diabetes mellitus with other specified complication: Secondary | ICD-10-CM | POA: Diagnosis not present

## 2024-02-15 DIAGNOSIS — D508 Other iron deficiency anemias: Secondary | ICD-10-CM | POA: Diagnosis not present

## 2024-02-15 DIAGNOSIS — N200 Calculus of kidney: Secondary | ICD-10-CM | POA: Diagnosis not present

## 2024-02-16 DIAGNOSIS — D508 Other iron deficiency anemias: Secondary | ICD-10-CM | POA: Diagnosis not present

## 2024-02-17 ENCOUNTER — Encounter: Payer: Self-pay | Admitting: Internal Medicine

## 2024-02-19 ENCOUNTER — Telehealth: Payer: Self-pay | Admitting: *Deleted

## 2024-02-19 ENCOUNTER — Encounter: Payer: Self-pay | Admitting: Internal Medicine

## 2024-02-19 NOTE — Telephone Encounter (Signed)
 Received call from pt inquiring if his visit next as a new pt will be covered by his insurance. Advised that the best way to find out that information would be to call his [employer]. Advised that they could tell him if the visits are covered and what his co-pay could be. Pt voiced understanding.

## 2024-02-25 ENCOUNTER — Encounter: Payer: Self-pay | Admitting: Hematology and Oncology

## 2024-02-25 ENCOUNTER — Inpatient Hospital Stay: Attending: Hematology and Oncology | Admitting: Hematology and Oncology

## 2024-02-25 ENCOUNTER — Inpatient Hospital Stay

## 2024-02-25 VITALS — BP 116/62 | HR 75 | Temp 97.7°F | Resp 17 | Ht 75.0 in | Wt 186.8 lb

## 2024-02-25 DIAGNOSIS — Z7982 Long term (current) use of aspirin: Secondary | ICD-10-CM | POA: Diagnosis not present

## 2024-02-25 DIAGNOSIS — D508 Other iron deficiency anemias: Secondary | ICD-10-CM

## 2024-02-25 DIAGNOSIS — Z8501 Personal history of malignant neoplasm of esophagus: Secondary | ICD-10-CM | POA: Diagnosis not present

## 2024-02-25 DIAGNOSIS — Z87891 Personal history of nicotine dependence: Secondary | ICD-10-CM | POA: Insufficient documentation

## 2024-02-25 DIAGNOSIS — Z9049 Acquired absence of other specified parts of digestive tract: Secondary | ICD-10-CM | POA: Diagnosis not present

## 2024-02-25 DIAGNOSIS — Z79899 Other long term (current) drug therapy: Secondary | ICD-10-CM | POA: Diagnosis not present

## 2024-02-25 DIAGNOSIS — Z9089 Acquired absence of other organs: Secondary | ICD-10-CM | POA: Insufficient documentation

## 2024-02-25 DIAGNOSIS — Z8673 Personal history of transient ischemic attack (TIA), and cerebral infarction without residual deficits: Secondary | ICD-10-CM | POA: Diagnosis not present

## 2024-02-25 DIAGNOSIS — D509 Iron deficiency anemia, unspecified: Secondary | ICD-10-CM | POA: Insufficient documentation

## 2024-02-25 DIAGNOSIS — Z800 Family history of malignant neoplasm of digestive organs: Secondary | ICD-10-CM | POA: Diagnosis not present

## 2024-02-25 DIAGNOSIS — Z820 Family history of epilepsy and other diseases of the nervous system: Secondary | ICD-10-CM | POA: Insufficient documentation

## 2024-02-25 DIAGNOSIS — Z87442 Personal history of urinary calculi: Secondary | ICD-10-CM | POA: Insufficient documentation

## 2024-02-25 NOTE — Progress Notes (Signed)
 [facility] CONSULT NOTE  Patient Care Team: Clarice Nottingham, MD as PCP - General (Internal Medicine) Anner Alm ORN, MD as PCP - Cardiology (Cardiology)  ASSESSMENT & PLAN:  Iron deficiency anemia The most likely cause of his iron deficiency anemia is due to consumption of diet poor in iron and chronic use of proton pump inhibitor affecting absorption of iron I recommend increasing oral iron supplement to once a day, to be taken half an hour before lunchtime I plan to recheck iron studies in 3 months If he is not able to absorb iron, we will provide intravenous iron infusion He may also need repeat EGD in the future if he has progressive anemia  Personal history of esophageal cancer He has remote history of esophageal cancer, almost 19 years ago At this point in time, he does not need active surveillance but if he has persistent anemia in the future, we might have to get repeat endoscopy evaluation Orders Placed This Encounter  Procedures   CBC with Differential (Cancer Center Only)    Standing Status:   Future    Expiration Date:   02/24/2025   Iron and Iron Binding Capacity ([facility] only)    Standing Status:   Future    Expiration Date:   02/24/2025   Ferritin    Standing Status:   Future    Expiration Date:   02/24/2025    All questions were answered. The patient knows to call the clinic with any problems, questions or concerns.  The total time spent in the appointment was 55 minutes encounter with patients including review of chart and various tests results, discussions about plan of care and coordination of care plan  Almarie Bedford, MD 10/30/20252:04 PM   CHIEF COMPLAINTS/PURPOSE OF CONSULTATION:  Anemia  HISTORY OF PRESENTING ILLNESS:  Peter Richmond 89 y.o. male is here because of anemia  He was found to have abnormal CBC from recent blood work I have reviewed his outside records His labs from Sep 24, 2023 : Hemoglobin was normal at 13.8 On February 10, 2024, hemoglobin was low at 12.4.  Ferritin was 19 He denies recent chest pain on exertion, shortness of breath on minimal exertion, pre-syncopal episodes, or palpitations. He had not noticed any recent bleeding such as epistaxis, hematuria or hematochezia The patient denies over the counter NSAID ingestion. He is on antiplatelets agents with aspirin due to remote history of stroke. His last colonoscopy was many years ago He has history of esophageal cancer in 2006 status post endoscopic resection He did not require adjuvant treatment His last surveillance EGD was more than 5 years ago He denies any pica and eats a variety of diet.  However, in going through his diet history, in general he would only eat meat once a day during dinnertime.  He is not consistent with his lunch.  The portion of meat consumption of is also minimum He has donated blood more than 10 times before but last was just prior to his diagnosis of esophageal cancer.  He has never received blood transfusion The patient was prescribed oral iron supplements and he takes 3 times a week with breakfast He takes omeprazole twice a day chronically  MEDICAL HISTORY:  Past Medical History:  Diagnosis Date   Calculus of right kidney    Cancer (HCC)    hx of esophageal cancer- 2006    GERD (gastroesophageal reflux disease)    History of esophageal cancer    S/P ESOPHAGECTOMY 2006 (STAGE  I)  NO CHEMORADIATION--  NO RECURRENCE   History of kidney stones    Poison oak    recent exposure at time of preop on 11/09/17 - on right arm and on back of legs per patient- almost healed    Pre-diabetes    Right bundle branch block     SURGICAL HISTORY: Past Surgical History:  Procedure Laterality Date   cataract surgery      bilateral    CYSTOSCOPY W/ URETERAL STENT PLACEMENT Right 01/10/2013   Procedure: CYSTOSCOPY WITH RETROGRADE PYELOGRAM/URETERAL STENT PLACEMENT;  Surgeon: Mark C Ottelin, MD;  Location: [facility];   Service: Urology;  Laterality: Right;   INGUINAL HERNIA REPAIR Left 05/07/2021   Procedure: OPEN LEFT INGUINAL HERNIA REPAIR WITH MESH;  Surgeon: Kinsinger, Herlene Righter, MD;  Location: [facility];  Service: General;  Laterality: Left;   IR URETERAL STENT RIGHT NEW ACCESS W/O SEP NEPHROSTOMY CATH  11/13/2017   LAPAROSCOPIC CHOLECYSTECTOMY  06-24-2005   NEPHROLITHOTOMY Right 11/13/2017   Procedure: NEPHROLITHOTOMY PERCUTANEOUS;  Surgeon: Ceil Anes, MD;  Location: [facility];  Service: Urology;  Laterality: Right;   TONSILLECTOMY  1943   TRANSHIATAL ESOPHAGECTOMY/ JEJUNOSTOMY/ PYLOROPLASTY  03-31-2005  DR BRANTLEY   ESOPHAGEAL CANCER--  NO RECURRENCE    SOCIAL HISTORY: Social History   Socioeconomic History   Marital status: Widowed    Spouse name: Not on file   Number of children: 2   Years of education: Not on file   Highest education level: Bachelor's degree (e.g., BA, AB, BS)  Occupational History    Comment: retired  Tobacco Use   Smoking status: Former    Current packs/day: 0.00    Types: Cigarettes    Start date: 01/01/1964    Quit date: 01/01/1984    Years since quitting: 40.1   Smokeless tobacco: Never   Tobacco comments:    was an OCCASIONAL SMOKER ON/ OFF   Vaping Use   Vaping status: Never Used  Substance and Sexual Activity   Alcohol use: Never   Drug use: Never   Sexual activity: Not on file  Other Topics Concern   Not on file  Social History Narrative   Lives alone   Social Drivers of Health   Financial Resource Strain: Not on file  Food Insecurity: No Food Insecurity (02/25/2024)   Hunger Vital Sign    Worried About Running Out of Food in the Last Year: Never true    Ran Out of Food in the Last Year: Never true  Transportation Needs: No Transportation Needs (02/25/2024)   PRAPARE - Administrator, Civil Service (Medical): No    Lack of Transportation (Non-Medical): No  Physical Activity: Not on file  Stress: Not on file  Social Connections: Not on  file  Intimate Partner Violence: Not At Risk (02/25/2024)   Humiliation, Afraid, Rape, and Kick questionnaire    Fear of Current or Ex-Partner: No    Emotionally Abused: No    Physically Abused: No    Sexually Abused: No    FAMILY HISTORY: Family History  Problem Relation Age of Onset   Pancreatic cancer Father    Parkinson's disease Sister     ALLERGIES:  is allergic to lansoprazole.  MEDICATIONS:  Current Outpatient Medications  Medication Sig Dispense Refill   Aspirin 81 MG CAPS Take by mouth.     carboxymethylcellulose (REFRESH PLUS) 0.5 % SOLN 1 drop 4 (four) times daily as needed (dry eyes).     cholecalciferol (VITAMIN  D) 1000 units tablet Take 1,000 Units by mouth daily.     colestipol (COLESTID) 5 g granules Take 5 g by mouth daily.     cyclobenzaprine (FLEXERIL) 10 MG tablet Take 10 mg by mouth 3 (three) times daily as needed for muscle spasms. (Patient not taking: Reported on 01/12/2024)     empagliflozin (JARDIANCE) 25 MG TABS tablet Take 12.5 mg by mouth daily.     Ferrous Gluconate 324 (37.5 Fe) MG TABS Take 1 tablet by mouth 3 (three) times a week.     metFORMIN (GLUCOPHAGE-XR) 500 MG 24 hr tablet Take 500 mg by mouth daily.     Omega-3 Fatty Acids (FISH OIL) 1000 MG CAPS Take 1,000 mg by mouth daily.     omeprazole (PRILOSEC) 20 MG capsule Take 20 mg by mouth 2 (two) times daily before a meal.     rosuvastatin (CRESTOR) 5 MG tablet Take 5 mg by mouth daily.     tamsulosin  (FLOMAX ) 0.4 MG CAPS capsule Take 0.4 mg by mouth daily.     vitamin B-12 (CYANOCOBALAMIN) 1000 MCG tablet Take 1,000 mcg by mouth daily. (Patient taking differently: Take 1,000 mcg by mouth 3 (three) times a week.)     No current facility-administered medications for this visit.    REVIEW OF SYSTEMS:   Constitutional: Denies fevers, chills or abnormal night sweats Eyes: Denies blurriness of vision, double vision or watery eyes Ears, nose, mouth, throat, and face: Denies mucositis or sore  throat Respiratory: Denies cough, dyspnea or wheezes Cardiovascular: Denies palpitation, chest discomfort or lower extremity swelling Gastrointestinal:  Denies nausea, heartburn or change in bowel habits Skin: Denies abnormal skin rashes Lymphatics: Denies new lymphadenopathy or easy bruising Neurological:Denies numbness, tingling or new weaknesses Behavioral/Psych: Mood is stable, no new changes  All other systems were reviewed with the patient and are negative.  PHYSICAL EXAMINATION: ECOG PERFORMANCE STATUS: 0 - Asymptomatic  Vitals:   02/25/24 1332  BP: 116/62  Pulse: 75  Resp: 17  Temp: 97.7 F (36.5 C)  SpO2: 98%   Filed Weights   02/25/24 1332  Weight: 186 lb 12.8 oz (84.7 kg)    GENERAL:alert, no distress and comfortable.  He looks thin SKIN: skin color, texture, turgor are normal, no rashes or significant lesions EYES: normal, conjunctiva are pink and non-injected, sclera clear OROPHARYNX:no exudate, no erythema and lips, buccal mucosa, and tongue normal  NECK: supple, thyroid normal size, non-tender, without nodularity LYMPH:  no palpable lymphadenopathy in the cervical, axillary or inguinal LUNGS: clear to auscultation and percussion with normal breathing effort HEART: regular rate & rhythm and no murmurs and no lower extremity edema ABDOMEN:abdomen soft, non-tender and normal bowel sounds Musculoskeletal:no cyanosis of digits and no clubbing  PSYCH: alert & oriented x 3 with fluent speech NEURO: no focal motor/sensory deficits

## 2024-02-25 NOTE — Assessment & Plan Note (Signed)
 He has remote history of esophageal cancer, almost 19 years ago At this point in time, he does not need active surveillance but if he has persistent anemia in the future, we might have to get repeat endoscopy evaluation

## 2024-02-25 NOTE — Assessment & Plan Note (Signed)
 The most likely cause of his iron deficiency anemia is due to consumption of diet poor in iron and chronic use of proton pump inhibitor affecting absorption of iron I recommend increasing oral iron supplement to once a day, to be taken half an hour before lunchtime I plan to recheck iron studies in 3 months If he is not able to absorb iron, we will provide intravenous iron infusion He may also need repeat EGD in the future if he has progressive anemia

## 2024-03-10 DIAGNOSIS — E1169 Type 2 diabetes mellitus with other specified complication: Secondary | ICD-10-CM | POA: Diagnosis not present

## 2024-03-10 DIAGNOSIS — K219 Gastro-esophageal reflux disease without esophagitis: Secondary | ICD-10-CM | POA: Diagnosis not present

## 2024-03-17 ENCOUNTER — Inpatient Hospital Stay: Admitting: Hematology and Oncology

## 2024-03-17 ENCOUNTER — Inpatient Hospital Stay

## 2024-03-28 DIAGNOSIS — N401 Enlarged prostate with lower urinary tract symptoms: Secondary | ICD-10-CM | POA: Diagnosis not present

## 2024-03-28 DIAGNOSIS — R351 Nocturia: Secondary | ICD-10-CM | POA: Diagnosis not present

## 2024-05-05 ENCOUNTER — Ambulatory Visit: Admitting: Neurology

## 2024-05-05 VITALS — BP 118/73 | HR 69 | Wt 180.0 lb

## 2024-05-05 DIAGNOSIS — M48061 Spinal stenosis, lumbar region without neurogenic claudication: Secondary | ICD-10-CM | POA: Insufficient documentation

## 2024-05-05 DIAGNOSIS — R269 Unspecified abnormalities of gait and mobility: Secondary | ICD-10-CM

## 2024-05-05 NOTE — Progress Notes (Signed)
 "  Chief Complaint  Patient presents with   New Patient (Initial Visit)    RM14, alone, referral for worsening balance and falls/Dr. Ryan Hives [facility] [phone]: pt reported frequent falls (fall screening completed), worsening balance since last visit which impact falling       ASSESSMENT AND PLAN  Peter Richmond is a 89 y.o. male   Slow Worsening gait abnormality Chronic low back pain  Examination showed mild right ankle dorsiflexion weakness, length-dependent sensory changes to midshin level, most related to his lumbar radiculopathy/stenosis  MRI of the lumbar spine to show multiple level degenerative changes, moderate canal stenosis L2-3, variable degree of foraminal narrowing  He has some improvement with physical therapy,  He denies significant pain, function well, did have some bowel and bladder issues, he does not want to proceed with surgical intervention,  Encourage moderate exercise,   Only return to clinic for new issues    DIAGNOSTIC DATA (LABS, IMAGING, TESTING) - I reviewed patient records, labs, notes, testing and imaging myself where available.   MEDICAL HISTORY:  Peter Richmond is a 89 year old male, seen in request by his primary care physician Dr. Hives, Ryan for evaluation of worsening gait abnormality, initial evaluation December 08, 2023    History is obtained from the patient and review of electronic medical records. I personally reviewed pertinent available imaging films in PACS.   PMHx of  Hx of kidney stone HLD DM Stroke Esophageal Cancer in 2006, s/p surgery,   He lives alone, still active, go to the gym regularly, noticed gradual worsening gait abnormality since 2023, occasionally, difficulty picking up his right foot,  He  has chronic low back pain, achiness, stay at midline, deny radiating pain,   He no longer feel confident walking in his neighborhood, worried about his gait, treadmill, he has good appetite, sleep well, no  bladder and bowel incontinence,   Personally reviewed MRI on Oct 6th 2023,  No acute intracranial abnormality or significant interval change. 2. 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 3. 2 small remote lacunar infarcts of the cerebellum.  MRI of the cervical spine showed mild degenerative changes but no cord compression  MRI of thoracic spine was essentially normal  UPDATE Jan 8th 2025: He is accompanied by his son at today's visit, received physical therapy in Sept 2025, he goes to [employer] and gym regularly, mild low back pain, he has urinary urgency, accident occasionally, 2/week, bowel incontinence since 2025,  Personally reviewed MRI of the brain October 2023, no corneal infarction at the head of the right caudate, small vessel disease MRI of cervical spine in October 2023: Multilevel degenerative changes most prominent C3-4 C4-5, no evidence of canal foraminal narrowing  MRI of thoracic spine August 2025 no significant abnormality MRI of lumbar spine August 2025, multilevel degenerative changes, L2-3, facet hypertrophy moderate canal stenosis, moderate left foraminal stenosis; L3-4, disc bulging, facet hypertrophy and ligamentum flavum hypertrophy, with mild canal and moderate left foraminal stenosis; L4-5, moderate right mild left foraminal stenosis He has mild length-dependent sensory changes, mild distal weakness, Previously we also completed extensive evaluation to rule out treatable causes of peripheral neuropathy, he do have well-controlled diabetes  He has gait abnormality, urinary and bowel problem most likely from his lumbar degenerative changes, stenosis, foraminal narrowing   PHYSICAL EXAM:   Vitals:   12/08/23 1030  BP: 128/77  Pulse: 70  Resp: 14  SpO2: 96%  Weight: 185 lb 8 oz (84.1 kg)  Height:  6' 3 (1.905 m)   Not recorded     Body mass index is 23.19 kg/m.  PHYSICAL EXAMNIATION:  Gen: NAD, conversant, well nourised, well groomed                      Cardiovascular: Regular rate rhythm, no peripheral edema, warm, nontender. Eyes: Conjunctivae clear without exudates or hemorrhage Neck: Supple, no carotid bruits. Pulmonary: Clear to auscultation bilaterally   NEUROLOGICAL EXAM:  MENTAL STATUS: Speech/cognition: Awake, alert, oriented to history taking and casual conversation CRANIAL NERVES: CN II: Visual fields are full to confrontation. Pupils are round equal and briskly reactive to light. CN III, IV, VI: extraocular movement are normal. No ptosis. CN V: Facial sensation is intact to light touch CN VII: Face is symmetric with normal eye closure  CN VIII: Hearing is normal to causal conversation. CN IX, X: Phonation is normal. CN XI: Head turning and shoulder shrug are intact  MOTOR: Mild right more than left ankle dorsiflexion weakness, rest of the muscle group testing was normal    REFLEXES: Reflexes are 2  and symmetric at the biceps, triceps, knees, and absent at ankles. Plantar responses are flexor bilaterally  SENSORY: Length-dependent decreased light touch, pinprick to mid shin level, decreased vibratory sensation to knee level  COORDINATION: There is no trunk or limb dysmetria noted.  GAIT/STANCE: Need push-up to get up from seated position, cautious  REVIEW OF SYSTEMS:  Full 14 system review of systems performed and notable only for as above All other review of systems were negative.   ALLERGIES: Allergies  Allergen Reactions   Lansoprazole Rash, Dermatitis and Hives    HOME MEDICATIONS: Current Outpatient Medications  Medication Sig Dispense Refill   Aspirin 81 MG CAPS Take by mouth.     carboxymethylcellulose (REFRESH PLUS) 0.5 % SOLN 1 drop 4 (four) times daily as needed (dry eyes).     cholecalciferol (VITAMIN D) 1000 units tablet Take 1,000 Units by mouth daily.     colestipol (COLESTID) 5 g granules Take 5 g by mouth daily.     cyclobenzaprine (FLEXERIL) 10 MG tablet Take 10 mg by  mouth 3 (three) times daily as needed for muscle spasms.     empagliflozin (JARDIANCE) 25 MG TABS tablet Take 12.5 mg by mouth daily.     ibuprofen  (ADVIL ) 800 MG tablet Take 1 tablet (800 mg total) by mouth every 8 (eight) hours as needed. 30 tablet 0   metFORMIN (GLUCOPHAGE-XR) 500 MG 24 hr tablet Take 500 mg by mouth daily.     Omega-3 Fatty Acids (FISH OIL) 1000 MG CAPS Take 1,000 mg by mouth daily.     omeprazole (PRILOSEC) 20 MG capsule Take 20 mg by mouth 2 (two) times daily before a meal.     rosuvastatin (CRESTOR) 5 MG tablet Take 5 mg by mouth daily.     tamsulosin  (FLOMAX ) 0.4 MG CAPS capsule Take 0.4 mg by mouth daily.     vitamin B-12 (CYANOCOBALAMIN) 1000 MCG tablet Take 1,000 mcg by mouth daily. (Patient taking differently: Take 1,000 mcg by mouth 3 (three) times a week.)     No current facility-administered medications for this visit.    PAST MEDICAL HISTORY: Past Medical History:  Diagnosis Date   Calculus of right kidney    Cancer (HCC)    hx of esophageal cancer- 2006    GERD (gastroesophageal reflux disease)    History of esophageal cancer    S/P ESOPHAGECTOMY 2006 (STAGE I)  NO CHEMORADIATION--  NO RECURRENCE   History of kidney stones    Poison oak    recent exposure at time of preop on 11/09/17 - on right arm and on back of legs per patient- almost healed    Pre-diabetes    Right bundle branch block     PAST SURGICAL HISTORY: Past Surgical History:  Procedure Laterality Date   cataract surgery      bilateral    CYSTOSCOPY W/ URETERAL STENT PLACEMENT Right 01/10/2013   Procedure: CYSTOSCOPY WITH RETROGRADE PYELOGRAM/URETERAL STENT PLACEMENT;  Surgeon: Mark C Ottelin, MD;  Location: [facility] [place];  Service: Urology;  Laterality: Right;   INGUINAL HERNIA REPAIR Left 05/07/2021   Procedure: OPEN LEFT INGUINAL HERNIA REPAIR WITH MESH;  Surgeon: Kinsinger, Herlene Righter, MD;  Location: [facility];  Service: General;  Laterality: Left;   IR URETERAL STENT  RIGHT NEW ACCESS W/O SEP NEPHROSTOMY CATH  11/13/2017   LAPAROSCOPIC CHOLECYSTECTOMY  06-24-2005   NEPHROLITHOTOMY Right 11/13/2017   Procedure: NEPHROLITHOTOMY PERCUTANEOUS;  Surgeon: Ceil Anes, MD;  Location: [facility];  Service: Urology;  Laterality: Right;   TONSILLECTOMY  1943   TRANSHIATAL ESOPHAGECTOMY/ JEJUNOSTOMY/ PYLOROPLASTY  03-31-2005  DR BRANTLEY   ESOPHAGEAL CANCER--  NO RECURRENCE    FAMILY HISTORY: Family History  Problem Relation Age of Onset   Pancreatic cancer Father    Parkinson's disease Sister     SOCIAL HISTORY: Social History   Socioeconomic History   Marital status: Widowed    Spouse name: Not on file   Number of children: 2   Years of education: Not on file   Highest education level: Bachelor's degree (e.g., BA, AB, BS)  Occupational History    Comment: retired  Tobacco Use   Smoking status: Former    Current packs/day: 0.00    Types: Cigarettes    Start date: 01/01/1964    Quit date: 01/01/1984    Years since quitting: 39.9   Smokeless tobacco: Never   Tobacco comments:    was an OCCASIONAL SMOKER ON/ OFF   Vaping Use   Vaping status: Never Used  Substance and Sexual Activity   Alcohol use: Never   Drug use: Never   Sexual activity: Not on file  Other Topics Concern   Not on file  Social History Narrative   Lives alone   Social Drivers of Health   Financial Resource Strain: Not on file  Food Insecurity: Not on file  Transportation Needs: Not on file  Physical Activity: Not on file  Stress: Not on file  Social Connections: Not on file  Intimate Partner Violence: Not on file      Modena Callander, M.D. Ph.D.  [facility] [address] [place], [place] [address] Ph: [phone] Fax: [phone]  CC:  Clarice Nottingham, MD [address] [place],  [place] [address]  Clarice Nottingham, MD   "

## 2024-05-25 ENCOUNTER — Ambulatory Visit: Admitting: Cardiology

## 2024-05-27 ENCOUNTER — Inpatient Hospital Stay: Attending: Hematology and Oncology

## 2024-05-27 DIAGNOSIS — Z8501 Personal history of malignant neoplasm of esophagus: Secondary | ICD-10-CM | POA: Insufficient documentation

## 2024-05-27 DIAGNOSIS — D508 Other iron deficiency anemias: Secondary | ICD-10-CM

## 2024-05-27 DIAGNOSIS — Z79899 Other long term (current) drug therapy: Secondary | ICD-10-CM | POA: Insufficient documentation

## 2024-05-27 DIAGNOSIS — D509 Iron deficiency anemia, unspecified: Secondary | ICD-10-CM | POA: Insufficient documentation

## 2024-05-27 LAB — CBC WITH DIFFERENTIAL (CANCER CENTER ONLY)
Abs Immature Granulocytes: 0.02 10*3/uL (ref 0.00–0.07)
Basophils Absolute: 0.1 10*3/uL (ref 0.0–0.1)
Basophils Relative: 1 %
Eosinophils Absolute: 0.2 10*3/uL (ref 0.0–0.5)
Eosinophils Relative: 3 %
HCT: 46.8 % (ref 39.0–52.0)
Hemoglobin: 15.1 g/dL (ref 13.0–17.0)
Immature Granulocytes: 0 %
Lymphocytes Relative: 29 %
Lymphs Abs: 2.6 10*3/uL (ref 0.7–4.0)
MCH: 28.5 pg (ref 26.0–34.0)
MCHC: 32.3 g/dL (ref 30.0–36.0)
MCV: 88.5 fL (ref 80.0–100.0)
Monocytes Absolute: 0.4 10*3/uL (ref 0.1–1.0)
Monocytes Relative: 5 %
Neutro Abs: 5.5 10*3/uL (ref 1.7–7.7)
Neutrophils Relative %: 62 %
Platelet Count: 165 10*3/uL (ref 150–400)
RBC: 5.29 MIL/uL (ref 4.22–5.81)
RDW: 18.6 % — ABNORMAL HIGH (ref 11.5–15.5)
WBC Count: 8.8 10*3/uL (ref 4.0–10.5)
nRBC: 0 % (ref 0.0–0.2)

## 2024-05-27 LAB — IRON AND IRON BINDING CAPACITY (CC-WL,HP ONLY)
Iron: 80 ug/dL (ref 45–182)
Saturation Ratios: 16 % — ABNORMAL LOW (ref 17.9–39.5)
TIBC: 505 ug/dL — ABNORMAL HIGH (ref 250–450)
UIBC: 425 ug/dL

## 2024-05-27 LAB — FERRITIN: Ferritin: 40 ng/mL (ref 24–336)

## 2024-06-03 ENCOUNTER — Inpatient Hospital Stay: Attending: Hematology and Oncology | Admitting: Hematology and Oncology

## 2024-06-03 VITALS — BP 127/62 | HR 62 | Temp 96.6°F | Resp 18 | Ht 75.0 in | Wt 180.4 lb

## 2024-06-03 DIAGNOSIS — D508 Other iron deficiency anemias: Secondary | ICD-10-CM

## 2024-06-03 NOTE — Progress Notes (Signed)
" ° °  [facility] OFFICE PROGRESS NOTE  Clarice Nottingham, MD  ASSESSMENT & PLAN:  Assessment & Plan Other iron deficiency anemia The most likely cause of his iron deficiency anemia is due to consumption of diet poor in iron and chronic use of proton pump inhibitor affecting absorption of iron I recommend increasing oral iron supplement to once a day, to be taken half an hour before lunchtime In the past 3 months, he was only taking oral iron supplement 3 times a week, with slight complication of queasiness but not prohibiting him from taking his medications Repeat labs show improvement of his hemoglobin to over 15 and ferritin of 40 He will continue taking oral iron supplement 3 times a week and he does not need to return    No orders of the defined types were placed in this encounter.   INTERVAL HISTORY: Patient returns for recurrent anemia Symptoms of anemia includes none We reviewed labs Since last time I saw him, he has been taking oral iron supplement 3 times a week  SUMMARY OF HEMATOLOGIC HISTORY:  He was found to have abnormal CBC from recent blood work I have reviewed his outside records His labs from Sep 24, 2023 : Hemoglobin was normal at 13.8 On February 10, 2024, hemoglobin was low at 12.4.  Ferritin was 19 He denies recent chest pain on exertion, shortness of breath on minimal exertion, pre-syncopal episodes, or palpitations. He had not noticed any recent bleeding such as epistaxis, hematuria or hematochezia The patient denies over the counter NSAID ingestion. He is on antiplatelets agents with aspirin due to remote history of stroke. His last colonoscopy was many years ago He has history of esophageal cancer in 2006 status post endoscopic resection He did not require adjuvant treatment His last surveillance EGD was more than 5 years ago He denies any pica and eats a variety of diet.  However, in going through his diet history, in general he would only eat  meat once a day during dinnertime.  He is not consistent with his lunch.  The portion of meat consumption of is also minimum He has donated blood more than 10 times before but last was just prior to his diagnosis of esophageal cancer.  He has never received blood transfusion The patient was prescribed oral iron supplements and he takes 3 times a week with breakfast He takes omeprazole twice a day chronically   Lab Results  Component Value Date   VITAMINB12 1,070 12/16/2019   FERRITIN 40 05/27/2024   HGB 15.1 05/27/2024   RBC 5.29 05/27/2024   Vitals:   06/03/24 1010  BP: 127/62  Pulse: 62  Resp: 18  Temp: (!) 96.6 F (35.9 C)  SpO2: 95%   "

## 2024-06-03 NOTE — Assessment & Plan Note (Addendum)
 The most likely cause of his iron deficiency anemia is due to consumption of diet poor in iron and chronic use of proton pump inhibitor affecting absorption of iron I recommend increasing oral iron supplement to once a day, to be taken half an hour before lunchtime In the past 3 months, he was only taking oral iron supplement 3 times a week, with slight complication of queasiness but not prohibiting him from taking his medications Repeat labs show improvement of his hemoglobin to over 15 and ferritin of 40 He will continue taking oral iron supplement 3 times a week and he does not need to return

## 2024-06-28 ENCOUNTER — Ambulatory Visit

## 2024-08-12 ENCOUNTER — Ambulatory Visit: Admitting: Cardiology
